# Patient Record
Sex: Male | Born: 1980 | Hispanic: No | Marital: Married | Smoking: Former smoker
Health system: Southern US, Community
[De-identification: ages and names within clinical notes are randomized; demographics above are authoritative.]

## PROBLEM LIST (undated history)

## (undated) DIAGNOSIS — R590 Localized enlarged lymph nodes: Secondary | ICD-10-CM

## (undated) DIAGNOSIS — Z973 Presence of spectacles and contact lenses: Secondary | ICD-10-CM

## (undated) DIAGNOSIS — R918 Other nonspecific abnormal finding of lung field: Secondary | ICD-10-CM

## (undated) DIAGNOSIS — J189 Pneumonia, unspecified organism: Secondary | ICD-10-CM

## (undated) HISTORY — PX: LEG SURGERY: SHX1003

---

## 2019-02-13 ENCOUNTER — Inpatient Hospital Stay (HOSPITAL_COMMUNITY)
Admission: EM | Admit: 2019-02-13 | Discharge: 2019-02-16 | DRG: 194 | Disposition: A | Discharge status: Home / Self Care | Payer: PRIVATE HEALTH INSURANCE | Source: Ambulatory Visit | Attending: Internal Medicine | Admitting: Internal Medicine

## 2019-02-13 ENCOUNTER — Observation Stay (HOSPITAL_COMMUNITY): Payer: PRIVATE HEALTH INSURANCE

## 2019-02-13 ENCOUNTER — Emergency Department (HOSPITAL_COMMUNITY): Payer: PRIVATE HEALTH INSURANCE

## 2019-02-13 ENCOUNTER — Other Ambulatory Visit: Payer: Self-pay

## 2019-02-13 ENCOUNTER — Encounter (HOSPITAL_COMMUNITY): Payer: Self-pay | Admitting: Obstetrics and Gynecology

## 2019-02-13 DIAGNOSIS — J918 Pleural effusion in other conditions classified elsewhere: Secondary | ICD-10-CM | POA: Diagnosis present

## 2019-02-13 DIAGNOSIS — R748 Abnormal levels of other serum enzymes: Secondary | ICD-10-CM

## 2019-02-13 DIAGNOSIS — E46 Unspecified protein-calorie malnutrition: Secondary | ICD-10-CM | POA: Diagnosis present

## 2019-02-13 DIAGNOSIS — Z6821 Body mass index (BMI) 21.0-21.9, adult: Secondary | ICD-10-CM

## 2019-02-13 DIAGNOSIS — J90 Pleural effusion, not elsewhere classified: Secondary | ICD-10-CM | POA: Diagnosis not present

## 2019-02-13 DIAGNOSIS — J189 Pneumonia, unspecified organism: Secondary | ICD-10-CM | POA: Diagnosis not present

## 2019-02-13 DIAGNOSIS — D649 Anemia, unspecified: Secondary | ICD-10-CM

## 2019-02-13 DIAGNOSIS — D509 Iron deficiency anemia, unspecified: Secondary | ICD-10-CM | POA: Diagnosis present

## 2019-02-13 DIAGNOSIS — B999 Unspecified infectious disease: Secondary | ICD-10-CM

## 2019-02-13 DIAGNOSIS — R0602 Shortness of breath: Secondary | ICD-10-CM | POA: Diagnosis not present

## 2019-02-13 DIAGNOSIS — Z20828 Contact with and (suspected) exposure to other viral communicable diseases: Secondary | ICD-10-CM | POA: Diagnosis present

## 2019-02-13 DIAGNOSIS — E876 Hypokalemia: Secondary | ICD-10-CM

## 2019-02-13 DIAGNOSIS — Z87891 Personal history of nicotine dependence: Secondary | ICD-10-CM

## 2019-02-13 DIAGNOSIS — D75839 Thrombocytosis, unspecified: Secondary | ICD-10-CM

## 2019-02-13 DIAGNOSIS — D473 Essential (hemorrhagic) thrombocythemia: Secondary | ICD-10-CM | POA: Diagnosis present

## 2019-02-13 LAB — CBC WITH DIFFERENTIAL/PLATELET
Abs Immature Granulocytes: 0.06 10*3/uL (ref 0.00–0.07)
Basophils Absolute: 0.1 10*3/uL (ref 0.0–0.1)
Basophils Relative: 1 %
Eosinophils Absolute: 0.1 10*3/uL (ref 0.0–0.5)
Eosinophils Relative: 1 %
HCT: 37.9 % — ABNORMAL LOW (ref 39.0–52.0)
Hemoglobin: 12.3 g/dL — ABNORMAL LOW (ref 13.0–17.0)
Immature Granulocytes: 1 %
Lymphocytes Relative: 16 %
Lymphs Abs: 1.2 10*3/uL (ref 0.7–4.0)
MCH: 31.4 pg (ref 26.0–34.0)
MCHC: 32.5 g/dL (ref 30.0–36.0)
MCV: 96.7 fL (ref 80.0–100.0)
Monocytes Absolute: 0.6 10*3/uL (ref 0.1–1.0)
Monocytes Relative: 8 %
Neutro Abs: 5.8 10*3/uL (ref 1.7–7.7)
Neutrophils Relative %: 73 %
Platelets: 549 10*3/uL — ABNORMAL HIGH (ref 150–400)
RBC: 3.92 MIL/uL — ABNORMAL LOW (ref 4.22–5.81)
RDW: 12.8 % (ref 11.5–15.5)
WBC: 7.8 10*3/uL (ref 4.0–10.5)
nRBC: 0 % (ref 0.0–0.2)

## 2019-02-13 LAB — COMPREHENSIVE METABOLIC PANEL
ALT: 43 U/L (ref 0–44)
AST: 23 U/L (ref 15–41)
Albumin: 2.8 g/dL — ABNORMAL LOW (ref 3.5–5.0)
Alkaline Phosphatase: 134 U/L — ABNORMAL HIGH (ref 38–126)
Anion gap: 11 (ref 5–15)
BUN: 8 mg/dL (ref 6–20)
CO2: 23 mmol/L (ref 22–32)
Calcium: 8.4 mg/dL — ABNORMAL LOW (ref 8.9–10.3)
Chloride: 101 mmol/L (ref 98–111)
Creatinine, Ser: 0.8 mg/dL (ref 0.61–1.24)
GFR calc Af Amer: >60 mL/min (ref >60)
GFR calc non Af Amer: >60 mL/min (ref >60)
Glucose, Bld: 86 mg/dL (ref 70–99)
Potassium: 3.4 mmol/L — ABNORMAL LOW (ref 3.5–5.1)
Sodium: 135 mmol/L (ref 135–145)
Total Bilirubin: 0.6 mg/dL (ref 0.3–1.2)
Total Protein: 7.8 g/dL (ref 6.5–8.1)

## 2019-02-13 LAB — BASIC METABOLIC PANEL
Anion gap: 8 (ref 5–15)
BUN: 8 mg/dL (ref 6–20)
CO2: 27 mmol/L (ref 22–32)
Calcium: 8.4 mg/dL — ABNORMAL LOW (ref 8.9–10.3)
Chloride: 102 mmol/L (ref 98–111)
Creatinine, Ser: 0.83 mg/dL (ref 0.61–1.24)
GFR calc Af Amer: >60 mL/min (ref >60)
GFR calc non Af Amer: >60 mL/min (ref >60)
Glucose, Bld: 95 mg/dL (ref 70–99)
Potassium: 3.9 mmol/L (ref 3.5–5.1)
Sodium: 137 mmol/L (ref 135–145)

## 2019-02-13 LAB — C-REACTIVE PROTEIN: CRP: 18.5 mg/dL — ABNORMAL HIGH (ref <1.0)

## 2019-02-13 LAB — PROCALCITONIN: Procalcitonin: <0.1 ng/mL

## 2019-02-13 LAB — SARS CORONAVIRUS 2 BY RT PCR (HOSPITAL ORDER, PERFORMED IN ~~LOC~~ HOSPITAL LAB): SARS Coronavirus 2: NEGATIVE

## 2019-02-13 LAB — MAGNESIUM: Magnesium: 2.3 mg/dL (ref 1.7–2.4)

## 2019-02-13 LAB — FIBRINOGEN: Fibrinogen: >800 mg/dL — ABNORMAL HIGH (ref 210–475)

## 2019-02-13 LAB — LACTATE DEHYDROGENASE: LDH: 203 U/L — ABNORMAL HIGH (ref 98–192)

## 2019-02-13 LAB — FERRITIN: Ferritin: 719 ng/mL — ABNORMAL HIGH (ref 24–336)

## 2019-02-13 LAB — TRIGLYCERIDES: Triglycerides: 71 mg/dL (ref <150)

## 2019-02-13 LAB — LACTIC ACID, PLASMA: Lactic Acid, Venous: 1.4 mmol/L (ref 0.5–1.9)

## 2019-02-13 LAB — D-DIMER, QUANTITATIVE: D-Dimer, Quant: 11.04 ug/mL-FEU — ABNORMAL HIGH (ref 0.00–0.50)

## 2019-02-13 MED ORDER — AZITHROMYCIN 250 MG PO TABS
500.0000 mg | ORAL_TABLET | Freq: Once | ORAL | Status: AC
  Administered 2019-02-13: 500 mg via ORAL
  Filled 2019-02-13: qty 2

## 2019-02-13 MED ORDER — IOHEXOL 350 MG/ML SOLN
100.0000 mL | Freq: Once | INTRAVENOUS | Status: AC | PRN
  Administered 2019-02-13: 100 mL via INTRAVENOUS

## 2019-02-13 MED ORDER — SODIUM CHLORIDE 0.9 % IV SOLN
3.0000 g | Freq: Four times a day (QID) | INTRAVENOUS | Status: DC
  Administered 2019-02-14: 3 g via INTRAVENOUS
  Filled 2019-02-13 (×3): qty 3

## 2019-02-13 MED ORDER — BENZONATATE 100 MG PO CAPS: 200.0000 mg | ORAL_CAPSULE | Freq: Three times a day (TID) | ORAL | Status: DC | PRN

## 2019-02-13 MED ORDER — SODIUM CHLORIDE 0.9 % IV SOLN
INTRAVENOUS | Status: DC
  Administered 2019-02-13: 23:00:00 via INTRAVENOUS

## 2019-02-13 MED ORDER — IOHEXOL 300 MG/ML  SOLN
75.0000 mL | Freq: Once | INTRAMUSCULAR | Status: AC | PRN
  Administered 2019-02-13: 75 mL via INTRAVENOUS

## 2019-02-13 MED ORDER — ACETAMINOPHEN 650 MG RE SUPP: 650.0000 mg | Freq: Four times a day (QID) | RECTAL | Status: DC | PRN

## 2019-02-13 MED ORDER — SODIUM CHLORIDE 0.9 % IV SOLN
3.0000 g | Freq: Once | INTRAVENOUS | Status: AC
  Administered 2019-02-13: 3 g via INTRAVENOUS

## 2019-02-13 MED ORDER — SODIUM CHLORIDE (PF) 0.9 % IJ SOLN
INTRAMUSCULAR | Status: AC
  Filled 2019-02-13: qty 50

## 2019-02-13 MED ORDER — SODIUM CHLORIDE 0.9 % IV SOLN
1.0000 g | Freq: Once | INTRAVENOUS | Status: AC
  Administered 2019-02-13: 1 g via INTRAVENOUS
  Filled 2019-02-13: qty 10

## 2019-02-13 MED ORDER — ACETAMINOPHEN 325 MG PO TABS
650.0000 mg | ORAL_TABLET | Freq: Four times a day (QID) | ORAL | Status: DC | PRN
  Filled 2019-02-13: qty 2

## 2019-02-13 MED ORDER — POTASSIUM CHLORIDE CRYS ER 20 MEQ PO TBCR
30.0000 meq | EXTENDED_RELEASE_TABLET | Freq: Once | ORAL | Status: AC
  Administered 2019-02-13: 30 meq via ORAL
  Filled 2019-02-13: qty 1

## 2019-02-13 NOTE — ED Triage Notes (Signed)
Per EMS: Pt is coming from home Pt has had difficulty breathing x3 weeks and tested negative for Covid last Tuesday.  Pt reportedly had a chest x-ray done and has a reported pleural effusion that they are requesting a CT w contrast for.

## 2019-02-13 NOTE — ED Notes (Signed)
Bed: AC45 Expected date:  Expected time:  Means of arrival:  Comments: EMS-pleural effusion

## 2019-02-13 NOTE — ED Notes (Signed)
ED TO INPATIENT HANDOFF REPORT  Name/Age/Gender Aaron Greer 38 y.o. male  Code Status    Code Status Orders  (From admission, onward)         Start     Ordered   02/13/19 2139  Full code  Continuous     02/13/19 2143        Code Status History    This patient has a current code status but no historical code status.      Home/SNF/Other Home  Chief Complaint Enfusion   Level of Care/Admitting Diagnosis ED Disposition    ED Disposition Condition Comment   Admit  Hospital Area: [facility] [[id number]]  Level of Care: Telemetry [5]  Admit to tele based on following criteria: Other see comments  Comments: Tachycardia  Covid Evaluation: N/A  Diagnosis: Pleural effusion [242230]  Admitting Physician: Shela Leff [[id number]]  Attending Physician: Shela Leff [[id number]]  PT Class (Do Not Modify): Observation [104]  PT Acc Code (Do Not Modify): Observation [10022]       Medical History History reviewed. No pertinent past medical history.  Allergies No Known Allergies  IV Location/Drains/Wounds Patient Lines/Drains/Airways Status   Active Line/Drains/Airways    Name:   Placement date:   Placement time:   Site:   Days:   Peripheral IV 02/13/19 Right Antecubital   02/13/19    1358    Antecubital   less than 1   Peripheral IV 02/13/19 Right Forearm   02/13/19    1650    Forearm   less than 1          Labs/Imaging Results for orders placed or performed during the hospital encounter of 02/13/19 (from the past 48 hour(s))  Basic metabolic panel     Status: Abnormal   Collection Time: 02/13/19  2:16 PM  Result Value Ref Range   Sodium 137 135 - 145 mmol/L   Potassium 3.9 3.5 - 5.1 mmol/L   Chloride 102 98 - 111 mmol/L   CO2 27 22 - 32 mmol/L   Glucose, Bld 95 70 - 99 mg/dL   BUN 8 6 - 20 mg/dL   Creatinine, Ser 0.83 0.61 - 1.24 mg/dL   Calcium 8.4 (L) 8.9 - 10.3 mg/dL   GFR calc non Af Amer >60 >60 mL/min   GFR calc Af Amer >60  >60 mL/min   Anion gap 8 5 - 15    Comment: Performed at [facility], [place] [address] [place], [place] [address]  CBC with Differential     Status: Abnormal   Collection Time: 02/13/19  2:16 PM  Result Value Ref Range   WBC 7.8 4.0 - 10.5 K/uL   RBC 3.92 (L) 4.22 - 5.81 MIL/uL   Hemoglobin 12.3 (L) 13.0 - 17.0 g/dL   HCT 37.9 (L) 39.0 - 52.0 %   MCV 96.7 80.0 - 100.0 fL   MCH 31.4 26.0 - 34.0 pg   MCHC 32.5 30.0 - 36.0 g/dL   RDW 12.8 11.5 - 15.5 %   Platelets 549 (H) 150 - 400 K/uL   nRBC 0.0 0.0 - 0.2 %   Neutrophils Relative % 73 %   Neutro Abs 5.8 1.7 - 7.7 K/uL   Lymphocytes Relative 16 %   Lymphs Abs 1.2 0.7 - 4.0 K/uL   Monocytes Relative 8 %   Monocytes Absolute 0.6 0.1 - 1.0 K/uL   Eosinophils Relative 1 %   Eosinophils Absolute 0.1 0.0 - 0.5 K/uL  Basophils Relative 1 %   Basophils Absolute 0.1 0.0 - 0.1 K/uL   Immature Granulocytes 1 %   Abs Immature Granulocytes 0.06 0.00 - 0.07 K/uL    Comment: Performed at [facility], [place] [address], [place], [place] [address]  SARS Coronavirus 2 [facility] order, Performed in [facility] hospital lab)     Status: None   Collection Time: 02/13/19  4:46 PM  Result Value Ref Range   SARS Coronavirus 2 NEGATIVE NEGATIVE    Comment: (NOTE) If result is NEGATIVE SARS-CoV-2 target nucleic acids are NOT DETECTED. The SARS-CoV-2 RNA is generally detectable in upper and lower  respiratory specimens during the acute phase of infection. The lowest  concentration of SARS-CoV-2 viral copies this assay can detect is 250  copies / mL. A negative result does not preclude SARS-CoV-2 infection  and should not be used as the sole basis for treatment or other  patient management decisions.  A negative result may occur with  improper specimen collection / handling, submission of specimen other  than nasopharyngeal swab, presence of viral mutation(s) within the  areas targeted by this assay, and inadequate  number of viral copies  (<250 copies / mL). A negative result must be combined with clinical  observations, patient history, and epidemiological information. If result is POSITIVE SARS-CoV-2 target nucleic acids are DETECTED. The SARS-CoV-2 RNA is generally detectable in upper and lower  respiratory specimens dur ing the acute phase of infection.  Positive  results are indicative of active infection with SARS-CoV-2.  Clinical  correlation with patient history and other diagnostic information is  necessary to determine patient infection status.  Positive results do  not rule out bacterial infection or co-infection with other viruses. If result is PRESUMPTIVE POSTIVE SARS-CoV-2 nucleic acids MAY BE PRESENT.   A presumptive positive result was obtained on the submitted specimen  and confirmed on repeat testing.  While 2019 novel coronavirus  (SARS-CoV-2) nucleic acids may be present in the submitted sample  additional confirmatory testing may be necessary for epidemiological  and / or clinical management purposes  to differentiate between  SARS-CoV-2 and other Sarbecovirus currently known to infect humans.  If clinically indicated additional testing with an alternate test  methodology [phone]) is advised. The SARS-CoV-2 RNA is generally  detectable in upper and lower respiratory sp ecimens during the acute  phase of infection. The expected result is Negative. Fact Sheet for Patients:  StrictlyIdeas.no Fact Sheet for Healthcare Providers: BankingDealers.co.za This test is not yet approved or cleared by the [place] FDA and has been authorized for detection and/or diagnosis of SARS-CoV-2 by FDA under an Emergency Use Authorization (EUA).  This EUA will remain in effect (meaning this test can be used) for the duration of the COVID-19 declaration under Section 564(b)(1) of the Act, 21 U.S.C. section 360bbb-3(b)(1), unless the  authorization is terminated or revoked sooner. Performed at [facility], [place] [address], [place], [place] [address]   Lactic acid, plasma     Status: None   Collection Time: 02/13/19  4:46 PM  Result Value Ref Range   Lactic Acid, Venous 1.4 0.5 - 1.9 mmol/L    Comment: Performed at [facility], [place] [address], [place],  [address]  Comprehensive metabolic panel     Status: Abnormal   Collection Time: 02/13/19  4:46 PM  Result Value Ref Range   Sodium 135 135 - 145 mmol/L   Potassium 3.4 (L) 3.5 - 5.1 mmol/L  Chloride 101 98 - 111 mmol/L   CO2 23 22 - 32 mmol/L   Glucose, Bld 86 70 - 99 mg/dL   BUN 8 6 - 20 mg/dL   Creatinine, Ser 0.80 0.61 - 1.24 mg/dL   Calcium 8.4 (L) 8.9 - 10.3 mg/dL   Total Protein 7.8 6.5 - 8.1 g/dL   Albumin 2.8 (L) 3.5 - 5.0 g/dL   AST 23 15 - 41 U/L   ALT 43 0 - 44 U/L   Alkaline Phosphatase 134 (H) 38 - 126 U/L   Total Bilirubin 0.6 0.3 - 1.2 mg/dL   GFR calc non Af Amer >60 >60 mL/min   GFR calc Af Amer >60 >60 mL/min   Anion gap 11 5 - 15    Comment: Performed at [facility], [place] [address] [place], [place] [address]  D-dimer, quantitative     Status: Abnormal   Collection Time: 02/13/19  4:46 PM  Result Value Ref Range   D-Dimer, Quant 11.04 (H) 0.00 - 0.50 ug/mL-FEU    Comment: (NOTE) At the manufacturer cut-off of 0.50 ug/mL FEU, this assay has been documented to exclude PE with a sensitivity and negative predictive value of 97 to 99%.  At this time, this assay has not been approved by the FDA to exclude DVT/VTE. Results should be correlated with clinical presentation. Performed at [facility], [place] [address], [place], [place] [address]   Procalcitonin     Status: None   Collection Time: 02/13/19  4:46 PM  Result Value Ref Range   Procalcitonin <0.10 ng/mL    Comment:        Interpretation: PCT (Procalcitonin) <= 0.5 ng/mL: Systemic infection  (sepsis) is not likely. Local bacterial infection is possible. (NOTE)       Sepsis PCT Algorithm           Lower Respiratory Tract                                      Infection PCT Algorithm    ----------------------------     ----------------------------         PCT < 0.25 ng/mL                PCT < 0.10 ng/mL         Strongly encourage             Strongly discourage   discontinuation of antibiotics    initiation of antibiotics    ----------------------------     -----------------------------       PCT 0.25 - 0.50 ng/mL            PCT 0.10 - 0.25 ng/mL               OR       >80% decrease in PCT            Discourage initiation of                                            antibiotics      Encourage discontinuation           of antibiotics    ----------------------------     -----------------------------         PCT >= 0.50 ng/mL  PCT 0.26 - 0.50 ng/mL               AND        <80% decrease in PCT             Encourage initiation of                                             antibiotics       Encourage continuation           of antibiotics    ----------------------------     -----------------------------        PCT >= 0.50 ng/mL                  PCT > 0.50 ng/mL               AND         increase in PCT                  Strongly encourage                                      initiation of antibiotics    Strongly encourage escalation           of antibiotics                                     -----------------------------                                           PCT <= 0.25 ng/mL                                                 OR                                        > 80% decrease in PCT                                     Discontinue / Do not initiate                                             antibiotics Performed at [place] [address] [place], [place] [address]   Lactate dehydrogenase     Status: Abnormal   Collection Time:  02/13/19  4:46 PM  Result Value Ref Range   LDH 203 (H) 98 - 192 U/L    Comment: Performed at [facility], [place] [address] [place], [place] [address]  Ferritin     Status: Abnormal   Collection Time: 02/13/19  4:46 PM  Result Value Ref  Range   Ferritin 719 (H) 24 - 336 ng/mL    Comment: Performed at [facility], [place] [address], [place], [place] [address]  Triglycerides     Status: None   Collection Time: 02/13/19  4:46 PM  Result Value Ref Range   Triglycerides 71 <150 mg/dL    Comment: Performed at [facility], [place] [address], [place], [place] [address]  Fibrinogen     Status: Abnormal   Collection Time: 02/13/19  4:46 PM  Result Value Ref Range   Fibrinogen >800 (H) 210 - 475 mg/dL    Comment: REPEATED TO VERIFY Performed at [facility], [place] [address], [place], [place] [address]   C-reactive protein     Status: Abnormal   Collection Time: 02/13/19  4:46 PM  Result Value Ref Range   CRP 18.5 (H) <1.0 mg/dL    Comment: Performed at [facility], [place] [address], [place], [place] [address]   Ct Chest W Contrast  Result Date: 02/13/2019 CLINICAL DATA:  Pleural effusion. EXAM: CT CHEST WITH CONTRAST TECHNIQUE: Multidetector CT imaging of the chest was performed during intravenous contrast administration. CONTRAST:  65mL OMNIPAQUE IOHEXOL 300 MG/ML  SOLN COMPARISON:  None. FINDINGS: Cardiovascular: No significant vascular findings. Normal heart size. No pericardial effusion. Mediastinum/Nodes: No enlarged mediastinal, hilar, or axillary lymph nodes. Thyroid gland, trachea, and esophagus demonstrate no significant findings. Lungs/Pleura: No pneumothorax is noted. Left lung is clear. Large probably loculated right pleural effusion is noted resulting in atelectasis of the right lung, particularly the right lower and middle lobes. Upper Abdomen: No acute abnormality. Musculoskeletal: No chest wall  abnormality. No acute or significant osseous findings. IMPRESSION: Large probably loculated right pleural effusion is noted resulting in significant atelectasis of the right lower and middle lobes. Electronically Signed   By: Marijo Conception M.D.   On: 02/13/2019 16:00    Pending Labs Unresulted Labs (From admission, onward)    Start     Ordered   02/14/19 0500  Vitamin B12  (Anemia Panel (PNL))  Tomorrow morning,   R     02/13/19 2143   02/14/19 0500  Folate  (Anemia Panel (PNL))  Tomorrow morning,   R     02/13/19 2143   02/14/19 0500  Iron and TIBC  (Anemia Panel (PNL))  Tomorrow morning,   R     02/13/19 2143   02/14/19 0500  Ferritin  (Anemia Panel (PNL))  Tomorrow morning,   R     02/13/19 2143   02/14/19 0500  Reticulocytes  (Anemia Panel (PNL))  Tomorrow morning,   R     02/13/19 2143   02/14/19 9417  Basic metabolic panel  Tomorrow morning,   R     02/13/19 2143   02/14/19 0500  Gamma GT  Tomorrow morning,   R     02/13/19 2143   02/13/19 2141  Magnesium  Add-on,   R     02/13/19 2143   02/13/19 2138  HIV antibody (Routine Testing)  Once,   R     02/13/19 2143   02/13/19 1626  Blood Culture (routine x 2)  BLOOD CULTURE X 2,   STAT     02/13/19 1626          Vitals/Pain Today's Vitals   02/13/19 2030 02/13/19 2100 02/13/19 2103 02/13/19 2130  BP: 104/69 109/70  110/70  Pulse: (!) 104 (!) 101  (!) 108  Resp: (!) 29 Marland Kitchen)  28  17  Temp:      TempSrc:      SpO2: 97% 96%  96%  PainSc:   0-No pain     Isolation Precautions No active isolations  Medications Medications  sodium chloride (PF) 0.9 % injection (has no administration in time range)  acetaminophen (TYLENOL) tablet 650 mg (has no administration in time range)    Or  acetaminophen (TYLENOL) suppository 650 mg (has no administration in time range)  0.9 %  sodium chloride infusion (has no administration in time range)  potassium chloride (K-DUR) CR tablet 30 mEq (has no administration in time range)   benzonatate (TESSALON) capsule 200 mg (has no administration in time range)  Ampicillin-Sulbactam (UNASYN) 3 g in sodium chloride 0.9 % 100 mL IVPB (has no administration in time range)  iohexol (OMNIPAQUE) 300 MG/ML solution 75 mL (75 mLs Intravenous Contrast Given 02/13/19 1543)  cefTRIAXone (ROCEPHIN) 1 g in sodium chloride 0.9 % 100 mL IVPB (0 g Intravenous Stopped 02/13/19 1839)  azithromycin (ZITHROMAX) tablet 500 mg (500 mg Oral Given 02/13/19 1723)    Mobility walks

## 2019-02-13 NOTE — ED Provider Notes (Signed)
[facility] DEPT Provider Note   CSN: [id number] Arrival date & time: 02/13/19  1216    History   Chief Complaint Chief Complaint  Patient presents with  . Pleural Effusion    HPI Aaron Greer is a 38 y.o. male.     Aaron Greer is a 38 y.o. male who is otherwise healthy, presents to the emergency department for evaluation of pleural effusion via EMS.  Patient reports that he has been experiencing worsening shortness of breath over the past 3 weeks.  [facility], had negative COVID testing this past Tuesday, but returns today due to persistent symptoms, a chest x-ray was performed which shows a large right-sided pleural effusion that is likely loculated.  Urgent care sent him here to the emergency department for CT with contrast for further characterization and evaluation of pleural effusion.  Patient reports that he is intermittently had some pains over the right side of his chest.  Pain is worse with deep breath.  He has not had any trauma or injury to the chest.  He has had an intermittent productive cough but has not had fever, reports occasional chills.  He is unsure of any sick contacts.  He denies any recent unintended weight loss.  He has had some nausea but no vomiting, mildly decreased appetite.  No abdominal pain.  Has not noticed any bumps or swollen lymph nodes.  No headache or vision change, no numbness or weakness.  He reports occasional smoking 10 years ago but no recent smoking history.  Denies other substance use.     History reviewed. No pertinent past medical history.  There are no active problems to display for this patient.   Past Surgical History:  Procedure Laterality Date  . LEG SURGERY          Home Medications    Prior to Admission medications   Medication Sig Start Date End Date Taking? Authorizing Provider  benzonatate (TESSALON) 100 MG capsule Take 100 mg by mouth 3 (three) times  daily as needed for cough.   Yes [provider]  ondansetron (ZOFRAN-ODT) 8 MG disintegrating tablet Take 8 mg by mouth every 8 (eight) hours as needed for nausea or vomiting.   Yes [provider]  promethazine-dextromethorphan (PROMETHAZINE-DM) 6.25-15 MG/5ML syrup Take 5 mLs by mouth 4 (four) times daily as needed for cough.   Yes [provider]    Family History No family history on file.  Social History Social History   Tobacco Use  . Smoking status: Former Smoker    Years: 10.00  . Smokeless tobacco: Never Used  . Tobacco comment: Quit in 2010  Substance Use Topics  . Alcohol use: Yes    Comment: Social  . Drug use: Never     Allergies   Patient has no known allergies.   Review of Systems Review of Systems  Constitutional: Positive for chills. Negative for fever and unexpected weight change.  HENT: Negative for congestion, rhinorrhea and sore throat.   Respiratory: Positive for choking and shortness of breath. Negative for chest tightness and wheezing.   Cardiovascular: Positive for chest pain. Negative for palpitations and leg swelling.  Gastrointestinal: Positive for nausea. Negative for abdominal pain, constipation and vomiting.  Genitourinary: Negative for dysuria and frequency.  Musculoskeletal: Negative for arthralgias, back pain (Left upper) and myalgias.  Skin: Negative for color change and rash.  Neurological: Negative for dizziness, syncope and light-headedness.  All other systems  reviewed and are negative.    Physical Exam Updated Vital Signs BP 108/67 (BP Location: Right Arm)   Pulse (!) 105   Temp 99 F (37.2 C) (Oral)   Resp 19   SpO2 98%   Physical Exam Vitals signs and nursing note reviewed.  Constitutional:      General: He is not in acute distress.    Appearance: Normal appearance. He is well-developed and normal weight. He is not ill-appearing, toxic-appearing or diaphoretic.     Comments: Patient is in no  acute distress, nontoxic appearing  HENT:     Head: Normocephalic and atraumatic.     Mouth/Throat:     Mouth: Mucous membranes are moist.     Pharynx: Oropharynx is clear.  Eyes:     General:        Right eye: No discharge.        Left eye: No discharge.  Neck:     Musculoskeletal: Neck supple.  Cardiovascular:     Rate and Rhythm: Normal rate and regular rhythm.     Pulses: Normal pulses.     Heart sounds: Normal heart sounds. No murmur. No friction rub. No gallop.   Pulmonary:     Effort: No respiratory distress.     Comments: Patient breathing comfortably on room air while sitting still, able to speak in complete short sentences, satting well on room air without tachypnea or significant increased respiratory effort.  Left lung field is clear.  Right lung field with breath sounds audible only in the apex, no tenderness or crepitus over the ribs.  No overlying ecchymosis or signs of trauma. Abdominal:     General: Abdomen is flat. Bowel sounds are normal. There is no distension.     Palpations: Abdomen is soft.     Tenderness: There is no abdominal tenderness. There is no guarding.  Musculoskeletal:     Right lower leg: No edema.     Left lower leg: No edema.  Skin:    General: Skin is warm and dry.  Neurological:     Mental Status: He is alert and oriented to person, place, and time. Mental status is at baseline.     Coordination: Coordination normal.  Psychiatric:        Mood and Affect: Mood normal.        Behavior: Behavior normal.      ED Treatments / Results  Labs (all labs ordered are listed, but only abnormal results are displayed) Labs Reviewed  BASIC METABOLIC PANEL - Abnormal; Notable for the following components:      Result Value   Calcium 8.4 (*)    All other components within normal limits  CBC WITH DIFFERENTIAL/PLATELET - Abnormal; Notable for the following components:   RBC 3.92 (*)    Hemoglobin 12.3 (*)    HCT 37.9 (*)    Platelets 549 (*)     All other components within normal limits    EKG None  Radiology None  Procedures Procedures (including critical care time)  Medications Ordered in ED Medications  sodium chloride (PF) 0.9 % injection (has no administration in time range)  iohexol (OMNIPAQUE) 300 MG/ML solution 75 mL (75 mLs Intravenous Contrast Given 02/13/19 1543)     Initial Impression / Assessment and Plan / ED Course  I have reviewed the triage vital signs and the nursing notes.  Pertinent labs & imaging results that were available during my care of the patient were reviewed by me and considered in  my medical decision making (see chart for details).  Patient sent from urgent care due to 3 weeks of persistent shortness of breath, on chest x-ray today found to have a large right-sided pleural effusion, was sent here for CT of the chest and further evaluation.  On arrival patient is in no acute distress, afebrile he has had some productive cough.  Had recent negative COVID testing at urgent care.  Is overall well-appearing, currently satting well on room air, has been intermittently tachycardic.  Reports dyspnea is worse with exertion.  He is also had some pleuritic pain on the right.  Given the patient is afebrile I have lower suspicion for empyema, patient does not have any known cancer diagnosis remote smoking history.  Could be a malignant effusion.  Patient does not have signs of fluid overload elsewhere, no lower extremity edema or abdominal distention.  We will get basic labs and a CT of the chest with contrast to better characterize pleural effusion.  Suspect patient may require admission for drainage and analysis of pleural fluid.  Labs significant for hemoglobin of 12.3, no leukocytosis further supporting that this is less likely of an infectious etiology, no acute electrolyte derangements.  At shift change still awaiting chest CT, care signed out to PA Domenic Moras who will follow-up on CT, anticipate admission.   Final Clinical Impressions(s) / ED Diagnoses   Final diagnoses:  Pleural effusion, right    ED Discharge Orders    None       Janet Berlin 02/13/19 [place], DO 02/14/19 [phone]

## 2019-02-13 NOTE — Progress Notes (Signed)
Pharmacy Antibiotic Note  Aaron Greer is a 38 y.o. male admitted on 1/66/0630 with Complicated pleural effusion.  Pharmacy has been consulted for Unasyn dosing.  Plan: Unasyn 3gm iv q6hr  Height: 5\' 8"  (172.7 cm) Weight: 143 lb 9.6 oz (65.1 kg) IBW/kg (Calculated) : 68.4  Temp (24hrs), Avg:99.8 F (37.7 C), Min:99 F (37.2 C), Max:101 F (38.3 C)  Recent Labs  Lab 02/13/19 1416 02/13/19 1646  WBC 7.8  --   CREATININE 0.83 0.80  LATICACIDVEN  --  1.4    Estimated Creatinine Clearance: 117.5 mL/min (by C-G formula based on SCr of 0.8 mg/dL).    No Known Allergies  Antimicrobials this admission: Unasyn 02/13/2019 >>  Dose adjustments this admission: -  Microbiology results: -  Thank you for allowing pharmacy to be a part of this patient's care.  Nani Skillern [place] 02/13/2019 10:16 PM

## 2019-02-13 NOTE — H&P (Signed)
History and Physical    Aaron Greer IRJ:[id number] DOB: [birth year] DOA: 02/13/2019  PCP: Patient, No Pcp Per Patient coming from: Home  Chief Complaint: Pleural effusion  HPI: Aaron Greer is a 38 y.o. male with no significant past medical history presenting to the hospital via EMS for evaluation of a pleural effusion.  Patient has been experiencing worsening shortness of breath over the past 3 weeks.  [facility] and had a negative COVID test this past Tuesday but returned today due to persistent symptoms.  Chest x-ray done at the urgent care center revealed a large right-sided pleural effusion that is likely loculated.  Urgent care center sent him to the ED for CT with contrast for further characterization and evaluation of the pleural effusion. Patient reports 3-week history of dyspnea, especially on exertion and cough.  He has been having fevers and chills for which he is taking Tylenol.  Also having right-sided pleuritic chest pain. Denies any history of blood clots.  Denies history of any other medical problems.  States he was previously smoking occasionally but quit 10 years ago.  Review of Systems: As per HPI otherwise 10 point review of systems negative.  History reviewed. No pertinent past medical history.  Past Surgical History:  Procedure Laterality Date   LEG SURGERY       reports that he has quit smoking. He quit after 10.00 years of use. He has never used smokeless tobacco. He reports current alcohol use. He reports that he does not use drugs.  No Known Allergies  No family history on file.  Prior to Admission medications   Medication Sig Start Date End Date Taking? Authorizing Provider  benzonatate (TESSALON) 100 MG capsule Take 100 mg by mouth 3 (three) times daily as needed for cough.   Yes [provider]  ondansetron (ZOFRAN-ODT) 8 MG disintegrating tablet Take 8 mg by mouth every 8 (eight) hours as needed for nausea or vomiting.    Yes [provider]  promethazine-dextromethorphan (PROMETHAZINE-DM) 6.25-15 MG/5ML syrup Take 5 mLs by mouth 4 (four) times daily as needed for cough.   Yes [provider]    Physical Exam: Vitals:   02/13/19 2100 02/13/19 2130 02/13/19 2210 02/13/19 2214  BP: 109/70 110/70  113/71  Pulse: (!) 101 (!) 108  (!) 101  Resp: (!) 28 17  14   Temp:    99.4 F (37.4 C)  TempSrc:    Oral  SpO2: 96% 96%  97%  Weight:   65.1 kg   Height:   5\' 8"  (1.727 m)     Physical Exam  Constitutional: He is oriented to person, place, and time. No distress.  HENT:  Head: Normocephalic.  Mouth/Throat: Oropharynx is clear and moist.  Eyes: Right eye exhibits no discharge. Left eye exhibits no discharge.  Neck: Neck supple.  Cardiovascular: Normal rate, regular rhythm and intact distal pulses.  Pulmonary/Chest: Effort normal. No respiratory distress. He has no wheezes. He has no rales.  Speaking clearly in full sentences.  Satting well on room air. Right lung: Decreased breath sounds in the lower and mid lung fields. Left lung: Normal breath sounds.  Abdominal: Soft. Bowel sounds are normal. He exhibits no distension. There is no abdominal tenderness. There is no guarding.  Musculoskeletal:        General: No edema.     Comments: No erythema, edema, increased warmth, or tenderness of lower extremities.  Neurological: He is alert and oriented to person, place,  and time.  Skin: Skin is warm and dry. He is not diaphoretic.     Labs on Admission: I have personally reviewed following labs and imaging studies  CBC: Recent Labs  Lab 02/13/19 1416  WBC 7.8  NEUTROABS 5.8  HGB 12.3*  HCT 37.9*  MCV 96.7  PLT 332*   Basic Metabolic Panel: Recent Labs  Lab 02/13/19 1416 02/13/19 1646 02/13/19 1700  NA 137 135  --   K 3.9 3.4*  --   CL 102 101  --   CO2 27 23  --   GLUCOSE 95 86  --   BUN 8 8  --   CREATININE 0.83 0.80  --   CALCIUM 8.4* 8.4*  --   MG  --   --  2.3    GFR: Estimated Creatinine Clearance: 117.5 mL/min (by C-G formula based on SCr of 0.8 mg/dL). Liver Function Tests: Recent Labs  Lab 02/13/19 1646  AST 23  ALT 43  ALKPHOS 134*  BILITOT 0.6  PROT 7.8  ALBUMIN 2.8*   No results for input(s): LIPASE, AMYLASE in the last 168 hours. No results for input(s): AMMONIA in the last 168 hours. Coagulation Profile: No results for input(s): INR, PROTIME in the last 168 hours. Cardiac Enzymes: No results for input(s): CKTOTAL, CKMB, CKMBINDEX, TROPONINI in the last 168 hours. BNP (last 3 results) No results for input(s): PROBNP in the last 8760 hours. HbA1C: No results for input(s): HGBA1C in the last 72 hours. CBG: No results for input(s): GLUCAP in the last 168 hours. Lipid Profile: Recent Labs    02/13/19 1646  TRIG 71   Thyroid Function Tests: No results for input(s): TSH, T4TOTAL, FREET4, T3FREE, THYROIDAB in the last 72 hours. Anemia Panel: Recent Labs    02/13/19 1646  FERRITIN 719*   Urine analysis: No results found for: COLORURINE, APPEARANCEUR, LABSPEC, [place], GLUCOSEU, HGBUR, BILIRUBINUR, KETONESUR, PROTEINUR, UROBILINOGEN, NITRITE, LEUKOCYTESUR  Radiological Exams on Admission: Ct Chest W Contrast  Result Date: 02/13/2019 CLINICAL DATA:  Pleural effusion. EXAM: CT CHEST WITH CONTRAST TECHNIQUE: Multidetector CT imaging of the chest was performed during intravenous contrast administration. CONTRAST:  65mL OMNIPAQUE IOHEXOL 300 MG/ML  SOLN COMPARISON:  None. FINDINGS: Cardiovascular: No significant vascular findings. Normal heart size. No pericardial effusion. Mediastinum/Nodes: No enlarged mediastinal, hilar, or axillary lymph nodes. Thyroid gland, trachea, and esophagus demonstrate no significant findings. Lungs/Pleura: No pneumothorax is noted. Left lung is clear. Large probably loculated right pleural effusion is noted resulting in atelectasis of the right lung, particularly the right lower and middle lobes. Upper  Abdomen: No acute abnormality. Musculoskeletal: No chest wall abnormality. No acute or significant osseous findings. IMPRESSION: Large probably loculated right pleural effusion is noted resulting in significant atelectasis of the right lower and middle lobes. Electronically Signed   By: Marijo Conception M.D.   On: 02/13/2019 16:00   Ct Angio Chest Pe W Or Wo Contrast  Result Date: 02/13/2019 CLINICAL DATA:  Shortness of breath, elevated D-dimer EXAM: CT ANGIOGRAPHY CHEST WITH CONTRAST TECHNIQUE: Multidetector CT imaging of the chest was performed using the standard protocol during bolus administration of intravenous contrast. Multiplanar CT image reconstructions and MIPs were obtained to evaluate the vascular anatomy. CONTRAST:  160mL OMNIPAQUE IOHEXOL 350 MG/ML SOLN COMPARISON:  Chest CT 02/13/2019 FINDINGS: Cardiovascular: Satisfactory opacification of the pulmonary arteries to the segmental level. No evidence of pulmonary embolism. Normal heart size. No pericardial effusion. Nonaneurysmal aorta. No dissection is seen. Mediastinum/Nodes: Midline trachea. No thyroid mass. Subcarinal  lymph node measuring up to 16 mm. Esophagus within normal limits. Lungs/Pleura: Large loculated right pleural effusion with compressive right lung atelectasis. The left lung is clear. Upper Abdomen: No acute abnormality. Musculoskeletal: No chest wall abnormality. No acute or significant osseous findings. Review of the MIP images confirms the above findings. IMPRESSION: 1. Negative for acute pulmonary embolus or aortic dissection. 2. Large loculated right pleural effusion with compressive atelectasis of the right lung Electronically Signed   By: Donavan Foil M.D.   On: 02/13/2019 22:23    EKG: Independently reviewed.  Sinus rhythm (heart rate 95).  Assessment/Plan Principal Problem:   Pleural effusion Active Problems:   Normocytic anemia   Thrombocytosis (HCC)   Elevated alkaline phosphatase level   Hypokalemia     Complicated pleural effusion Patient presenting with a 3-week history of persistent shortness of breath.  Febrile with temperature 101 F.  Slightly tachycardic.  No increased work of breathing at this time.  Satting well on room air.  No leukocytosis.  Lactic acid normal.  Repeat SARS-CoV-2 test done today in the ED again negative.  D-dimer significantly elevated at 11.04.  Procalcitonin less than 0.10.  LDH mildly elevated.  Ferritin elevated.  Triglycerides normal.  Fibrinogen significantly elevated at >800.  CRP elevated at 18.5.  Does not appear volume overloaded on exam.  Chest CT with contrast showing large probably loculated right pleural effusion resulting in significant atelectasis of the right lower and middle lobes.  CT angiogram negative for acute PE. No clinical signs of DVT on exam. -ED physician discussed the case with IR, patient will undergo thoracentesis in the morning. -IR ultrasound-guided thoracentesis and pleural fluid analysis labs have been ordered -IV Unasyn -Check BNP -Blood culture x2 -Supplemental oxygen if needed -Tessalon Perles PRN cough -Continuous pulse ox  Normocytic anemia Hemoglobin 12.3, MCV 96.7.  No prior labs for comparison. -Anemia panel  Thrombocytosis Platelet count 549.  No prior labs for comparison.  Not a current cigarette smoker. -Repeat BMP in a.m. to confirm  Elevated alkaline phosphatase Alkaline phosphatase mildly elevated 134, remainder of LFTs normal.  No prior labs for comparison. -Check GGT to differentiate between intra vs extrahepatic cause of elevated alkaline phosphatase  Mild hypokalemia Potassium 3.4.  Magnesium normal. -Replete potassium. Continue to monitor BMP.  DVT prophylaxis: SCDs Code Status: Full code Family Communication: No family available. Disposition Plan: Anticipate discharge after clinical improvement. Consults called: Interventional radiology (Dr. Kathlene Cote) Admission status: Observation, telemetry  This  chart was dictated using voice recognition software.  Despite best efforts to proofread, errors can occur which can change the documentation meaning.  Shela Leff MD [facility] Pager [phone]  If 7PM-7AM, please contact night-coverage [email or web address] Password [email or web address]  02/14/2019, 12:01 AM

## 2019-02-13 NOTE — ED Provider Notes (Signed)
Received signout at the beginning of shift.  Patient here with progressive shortness of breath ongoing for the past 3 weeks.  Was initially seen in urgent care several days ago and had a  negative COVID-19 test.  Chest x-ray today shows R pleural effusion.  Patient sent here for CT scan of the chest for further evaluation.  Anticipate hospitalization for further work-up of his condition.  4:27 PM For CT demonstrated large probably loculated right pleural effusion resulting in significant atelectasis of the right lower and middle lobe.  On reevaluation, patient did endorse having fever and chills for the past week as well as dry cough.  Denies any recent sick contact.  No history of cancer.  Although he did have a negative COVID-19 test 10 days ago, significant right pleural effusion fever, patient will need to be admitted for evaluation.  Will obtain COVID-19 associated lab.  Also initiated Rocephin and Zithromax antibiotic.  8:10 PM COVID-19 test is negative. Will consult for admission.  Pt does meet sepsis criteria (fever, tachycardia, tachypnea, PNA).  Sepsis reassessment done.  Normal lactic acid, therefore pt was not given fluid resuscitation at 26ml/kg.  His inflammatory markers are elevated.   9:15 PM Appreciate consultation from [facility] Dr. Marlowe Sax who agrees to admit pt.  She request IR to be involved for thoracentesis.  I spoke with IR Dr. Kathlene Cote who request order to be placed and pt will receive thoracentesis tomorrow.    CRITICAL CARE Performed by: Domenic Moras Total critical care time: 45 minutes Critical care time was exclusive of separately billable procedures and treating other patients. Critical care was necessary to treat or prevent imminent or life-threatening deterioration. Critical care was time spent personally by me on the following activities: development of treatment plan with patient and/or surrogate as well as nursing, discussions with consultants, evaluation of  patient's response to treatment, examination of patient, obtaining history from patient or surrogate, ordering and performing treatments and interventions, ordering and review of laboratory studies, ordering and review of radiographic studies, pulse oximetry and re-evaluation of patient's condition.  BP 109/70   Pulse (!) 101   Temp 99.8 F (37.7 C) (Oral)   Resp (!) 28   SpO2 96%   Results for orders placed or performed during the hospital encounter of 02/13/19  SARS Coronavirus 2 [facility] order, Performed in [facility] hospital lab)  Result Value Ref Range   SARS Coronavirus 2 NEGATIVE NEGATIVE  Basic metabolic panel  Result Value Ref Range   Sodium 137 135 - 145 mmol/L   Potassium 3.9 3.5 - 5.1 mmol/L   Chloride 102 98 - 111 mmol/L   CO2 27 22 - 32 mmol/L   Glucose, Bld 95 70 - 99 mg/dL   BUN 8 6 - 20 mg/dL   Creatinine, Ser 0.83 0.61 - 1.24 mg/dL   Calcium 8.4 (L) 8.9 - 10.3 mg/dL   GFR calc non Af Amer >60 >60 mL/min   GFR calc Af Amer >60 >60 mL/min   Anion gap 8 5 - 15  CBC with Differential  Result Value Ref Range   WBC 7.8 4.0 - 10.5 K/uL   RBC 3.92 (L) 4.22 - 5.81 MIL/uL   Hemoglobin 12.3 (L) 13.0 - 17.0 g/dL   HCT 37.9 (L) 39.0 - 52.0 %   MCV 96.7 80.0 - 100.0 fL   MCH 31.4 26.0 - 34.0 pg   MCHC 32.5 30.0 - 36.0 g/dL   RDW 12.8 11.5 - 15.5 %   Platelets  549 (H) 150 - 400 K/uL   nRBC 0.0 0.0 - 0.2 %   Neutrophils Relative % 73 %   Neutro Abs 5.8 1.7 - 7.7 K/uL   Lymphocytes Relative 16 %   Lymphs Abs 1.2 0.7 - 4.0 K/uL   Monocytes Relative 8 %   Monocytes Absolute 0.6 0.1 - 1.0 K/uL   Eosinophils Relative 1 %   Eosinophils Absolute 0.1 0.0 - 0.5 K/uL   Basophils Relative 1 %   Basophils Absolute 0.1 0.0 - 0.1 K/uL   Immature Granulocytes 1 %   Abs Immature Granulocytes 0.06 0.00 - 0.07 K/uL  Lactic acid, plasma  Result Value Ref Range   Lactic Acid, Venous 1.4 0.5 - 1.9 mmol/L  Comprehensive metabolic panel  Result Value Ref Range   Sodium 135 135 -  145 mmol/L   Potassium 3.4 (L) 3.5 - 5.1 mmol/L   Chloride 101 98 - 111 mmol/L   CO2 23 22 - 32 mmol/L   Glucose, Bld 86 70 - 99 mg/dL   BUN 8 6 - 20 mg/dL   Creatinine, Ser 0.80 0.61 - 1.24 mg/dL   Calcium 8.4 (L) 8.9 - 10.3 mg/dL   Total Protein 7.8 6.5 - 8.1 g/dL   Albumin 2.8 (L) 3.5 - 5.0 g/dL   AST 23 15 - 41 U/L   ALT 43 0 - 44 U/L   Alkaline Phosphatase 134 (H) 38 - 126 U/L   Total Bilirubin 0.6 0.3 - 1.2 mg/dL   GFR calc non Af Amer >60 >60 mL/min   GFR calc Af Amer >60 >60 mL/min   Anion gap 11 5 - 15  D-dimer, quantitative  Result Value Ref Range   D-Dimer, Quant 11.04 (H) 0.00 - 0.50 ug/mL-FEU  Procalcitonin  Result Value Ref Range   Procalcitonin <0.10 ng/mL  Lactate dehydrogenase  Result Value Ref Range   LDH 203 (H) 98 - 192 U/L  Ferritin  Result Value Ref Range   Ferritin 719 (H) 24 - 336 ng/mL  Triglycerides  Result Value Ref Range   Triglycerides 71 <150 mg/dL  Fibrinogen  Result Value Ref Range   Fibrinogen >800 (H) 210 - 475 mg/dL  C-reactive protein  Result Value Ref Range   CRP 18.5 (H) <1.0 mg/dL   Ct Chest W Contrast  Result Date: 02/13/2019 CLINICAL DATA:  Pleural effusion. EXAM: CT CHEST WITH CONTRAST TECHNIQUE: Multidetector CT imaging of the chest was performed during intravenous contrast administration. CONTRAST:  42mL OMNIPAQUE IOHEXOL 300 MG/ML  SOLN COMPARISON:  None. FINDINGS: Cardiovascular: No significant vascular findings. Normal heart size. No pericardial effusion. Mediastinum/Nodes: No enlarged mediastinal, hilar, or axillary lymph nodes. Thyroid gland, trachea, and esophagus demonstrate no significant findings. Lungs/Pleura: No pneumothorax is noted. Left lung is clear. Large probably loculated right pleural effusion is noted resulting in atelectasis of the right lung, particularly the right lower and middle lobes. Upper Abdomen: No acute abnormality. Musculoskeletal: No chest wall abnormality. No acute or significant osseous findings.  IMPRESSION: Large probably loculated right pleural effusion is noted resulting in significant atelectasis of the right lower and middle lobes. Electronically Signed   By: Marijo Conception M.D.   On: 02/13/2019 16:00       Domenic Moras, PA-C 02/13/19 2116    Virgel Manifold, MD 02/13/19 2257

## 2019-02-14 ENCOUNTER — Inpatient Hospital Stay (HOSPITAL_COMMUNITY): Payer: PRIVATE HEALTH INSURANCE

## 2019-02-14 ENCOUNTER — Observation Stay (HOSPITAL_COMMUNITY): Payer: PRIVATE HEALTH INSURANCE

## 2019-02-14 DIAGNOSIS — E46 Unspecified protein-calorie malnutrition: Secondary | ICD-10-CM | POA: Diagnosis present

## 2019-02-14 DIAGNOSIS — D75839 Thrombocytosis, unspecified: Secondary | ICD-10-CM

## 2019-02-14 DIAGNOSIS — E876 Hypokalemia: Secondary | ICD-10-CM | POA: Diagnosis present

## 2019-02-14 DIAGNOSIS — Z87891 Personal history of nicotine dependence: Secondary | ICD-10-CM | POA: Diagnosis not present

## 2019-02-14 DIAGNOSIS — Z6821 Body mass index (BMI) 21.0-21.9, adult: Secondary | ICD-10-CM | POA: Diagnosis not present

## 2019-02-14 DIAGNOSIS — D509 Iron deficiency anemia, unspecified: Secondary | ICD-10-CM | POA: Diagnosis present

## 2019-02-14 DIAGNOSIS — Z20828 Contact with and (suspected) exposure to other viral communicable diseases: Secondary | ICD-10-CM | POA: Diagnosis present

## 2019-02-14 DIAGNOSIS — D649 Anemia, unspecified: Secondary | ICD-10-CM

## 2019-02-14 DIAGNOSIS — J918 Pleural effusion in other conditions classified elsewhere: Secondary | ICD-10-CM | POA: Diagnosis present

## 2019-02-14 DIAGNOSIS — D473 Essential (hemorrhagic) thrombocythemia: Secondary | ICD-10-CM

## 2019-02-14 DIAGNOSIS — J189 Pneumonia, unspecified organism: Secondary | ICD-10-CM | POA: Diagnosis present

## 2019-02-14 DIAGNOSIS — J181 Lobar pneumonia, unspecified organism: Secondary | ICD-10-CM

## 2019-02-14 DIAGNOSIS — R748 Abnormal levels of other serum enzymes: Secondary | ICD-10-CM

## 2019-02-14 DIAGNOSIS — R0602 Shortness of breath: Secondary | ICD-10-CM | POA: Diagnosis present

## 2019-02-14 LAB — BASIC METABOLIC PANEL
Anion gap: 9 (ref 5–15)
BUN: 10 mg/dL (ref 6–20)
CO2: 24 mmol/L (ref 22–32)
Calcium: 8.1 mg/dL — ABNORMAL LOW (ref 8.9–10.3)
Chloride: 102 mmol/L (ref 98–111)
Creatinine, Ser: 0.82 mg/dL (ref 0.61–1.24)
GFR calc Af Amer: >60 mL/min (ref >60)
GFR calc non Af Amer: >60 mL/min (ref >60)
Glucose, Bld: 113 mg/dL — ABNORMAL HIGH (ref 70–99)
Potassium: 3.6 mmol/L (ref 3.5–5.1)
Sodium: 135 mmol/L (ref 135–145)

## 2019-02-14 LAB — BODY FLUID CELL COUNT WITH DIFFERENTIAL
Eos, Fluid: 0 %
Lymphs, Fluid: 94 %
Monocyte-Macrophage-Serous Fluid: 5 % — ABNORMAL LOW (ref 50–90)
Neutrophil Count, Fluid: 1 % (ref 0–25)
Total Nucleated Cell Count, Fluid: 2473 cu mm — ABNORMAL HIGH (ref 0–1000)

## 2019-02-14 LAB — VITAMIN B12: Vitamin B-12: 1115 pg/mL — ABNORMAL HIGH (ref 180–914)

## 2019-02-14 LAB — RETICULOCYTES
Immature Retic Fract: 10.4 % (ref 2.3–15.9)
RBC.: 3.69 MIL/uL — ABNORMAL LOW (ref 4.22–5.81)
Retic Count, Absolute: 32.5 10*3/uL (ref 19.0–186.0)
Retic Ct Pct: 0.9 % (ref 0.4–3.1)

## 2019-02-14 LAB — BRAIN NATRIURETIC PEPTIDE: B Natriuretic Peptide: 30.8 pg/mL (ref 0.0–100.0)

## 2019-02-14 LAB — IRON AND TIBC
Iron: 16 ug/dL — ABNORMAL LOW (ref 45–182)
Saturation Ratios: 10 % — ABNORMAL LOW (ref 17.9–39.5)
TIBC: 155 ug/dL — ABNORMAL LOW (ref 250–450)
UIBC: 139 ug/dL

## 2019-02-14 LAB — HIV ANTIBODY (ROUTINE TESTING W REFLEX): HIV Screen 4th Generation wRfx: NONREACTIVE

## 2019-02-14 LAB — FERRITIN: Ferritin: 628 ng/mL — ABNORMAL HIGH (ref 24–336)

## 2019-02-14 LAB — LACTATE DEHYDROGENASE, PLEURAL OR PERITONEAL FLUID: LD, Fluid: 543 U/L — ABNORMAL HIGH (ref 3–23)

## 2019-02-14 LAB — FOLATE: Folate: 11.9 ng/mL (ref >5.9)

## 2019-02-14 LAB — PROTEIN, PLEURAL OR PERITONEAL FLUID: Total protein, fluid: 5.2 g/dL

## 2019-02-14 LAB — GLUCOSE, PLEURAL OR PERITONEAL FLUID: Glucose, Fluid: 63 mg/dL

## 2019-02-14 LAB — GAMMA GT: GGT: 262 U/L — ABNORMAL HIGH (ref 7–50)

## 2019-02-14 MED ORDER — FERROUS GLUCONATE 324 (38 FE) MG PO TABS
324.0000 mg | ORAL_TABLET | Freq: Every day | ORAL | Status: DC
  Administered 2019-02-15 – 2019-02-16 (×2): 324 mg via ORAL
  Filled 2019-02-14 (×2): qty 1

## 2019-02-14 MED ORDER — LACTATED RINGERS IV SOLN
INTRAVENOUS | Status: DC
  Administered 2019-02-14 – 2019-02-15 (×2): via INTRAVENOUS

## 2019-02-14 MED ORDER — POTASSIUM CHLORIDE CRYS ER 20 MEQ PO TBCR
40.0000 meq | EXTENDED_RELEASE_TABLET | Freq: Once | ORAL | Status: AC
  Administered 2019-02-14: 40 meq via ORAL
  Filled 2019-02-14: qty 2

## 2019-02-14 MED ORDER — LIDOCAINE HCL 1 % IJ SOLN
INTRAMUSCULAR | Status: AC
  Filled 2019-02-14: qty 20

## 2019-02-14 MED ORDER — AZITHROMYCIN 250 MG PO TABS
500.0000 mg | ORAL_TABLET | Freq: Every day | ORAL | Status: DC
  Administered 2019-02-14 – 2019-02-15 (×2): 500 mg via ORAL
  Filled 2019-02-14 (×2): qty 2

## 2019-02-14 MED ORDER — SODIUM CHLORIDE 0.9 % IV SOLN
1.0000 g | INTRAVENOUS | Status: DC
  Administered 2019-02-14 – 2019-02-15 (×2): 1 g via INTRAVENOUS
  Filled 2019-02-14 (×2): qty 10
  Filled 2019-02-14 (×2): qty 1

## 2019-02-14 NOTE — Progress Notes (Signed)
Pharmacy Antibiotic Note  Aaron Greer is a 38 y.o. male admitted on 04/12/2978 with Complicated pleural effusion.  Pharmacy has been consulted for Unasyn dosing.  Plan: Continue Unasyn 3gm iv q6hr No dose adjustments needed, Pharmacy will sign off  Height: 5\' 8"  (172.7 cm) Weight: 143 lb 9.6 oz (65.1 kg) IBW/kg (Calculated) : 68.4  Temp (24hrs), Avg:99.7 F (37.6 C), Min:99 F (37.2 C), Max:101 F (38.3 C)  Recent Labs  Lab 02/13/19 1416 02/13/19 1646 02/14/19 0135  WBC 7.8  --   --   CREATININE 0.83 0.80 0.82  LATICACIDVEN  --  1.4  --     Estimated Creatinine Clearance: 114.7 mL/min (by C-G formula based on SCr of 0.82 mg/dL).    No Known Allergies  Antimicrobials this admission: Unasyn 02/14/2019 >>  Dose adjustments this admission: None  Microbiology results: 4/24 BCx: sent 4/24 COVID: neg 4/25 HIV Ab: IP  Thank you for allowing pharmacy to be a part of this patient's care.  Peggyann Juba, PharmD, BCPS Pager: [phone] 02/14/2019 8:03 AM

## 2019-02-14 NOTE — Progress Notes (Signed)
PROGRESS NOTE    Aaron Greer  QMG:[id number] DOB: Mar 17, 1981 DOA: 02/13/2019 PCP: Patient, No Pcp Per    Brief Narrative:  38 year old male who presented with a pleural effusion.  He has no significant past medical history.  Reported 3 weeks of worsening dyspnea, associated with fevers, chills, pleuritic chest pain and cough.  He was diagnosed as an outpatient with right pleural effusion, sent to the hospital for further evaluation.  On his initial physical examination blood pressure 109/70, pulse rate 101, respiratory rate 28, temperature 99.4-101 F, oxygen saturation 96%.  He had moist mucous membranes, heart S1-S2 present and rhythmic, his lungs show decreased breath sounds at the right side, no increased work of breathing, vesicular sounds on the left, abdomen soft, no lower extremity edema.  Sodium 135, potassium 3.4, chloride 101, bicarb 23, glucose 86, BUN 8, creatinine 0.8, white cell count 7.8, hemoglobin 12.3, hematocrit 37.9, platelets 549.  D-dimer 11, fibrinogen more than 800.  CT chest with a large probably loculated right pleural effusion with compression atelectasis of the right lower and middle lobes, no pulmonary embolism.  His EKG had 95 bpm, normal axis, normal intervals, sinus with normal conduction, no ST segment or T wave changes.  Patient was admitted to the hospital with the working diagnosis of right pleural effusion/ likely para-pneumonic    Assessment & Plan:   Principal Problem:   Pleural effusion Active Problems:   Normocytic anemia   Thrombocytosis (HCC)   Elevated alkaline phosphatase level   Hypokalemia  1. Right lower lobe community acquired pneumonia, complicated with large right para-pneumonic effusion. Will continue antibiotic therapy with ceftriaxone and azithromycin, patient with low pretest probability for aspiration dc Unasyn. Will continue oxymetry monitoring, as needed supplemental oxygen per [place] and anti tussive. Patient tested negative for COVID 19.  Will check streptococcal and legionella urine antigen. For thoracentesis today, follow on fluid analysis and cultures. Patient has remained afebrile with no leukocytosis, cultures continue with no growth. Check HIV.   2. Hypokalemia. Will continue K correction with Kcl, will allow patient to eat, follow on renal panel in am. Will add gentle IV fluids with balanced electrolyte solutions.   3. Iron deficiency anemia with reactive thrombocytosis. Serum iron is 16 with transferrin saturation of 10, elevated ferrin as acute phase reactant. Will start patient on po Iron.   4. Hypoalbuminemia. Suspected calorie protein malnutrition, unspecified severity, will consult nutrition for further recommendations.    DVT prophylaxis: enoxaparin   Code Status:  full Family Communication:  No family at the bedside  Disposition Plan/ discharge barriers: pending clinical improvement   Body mass index is 21.83 kg/m. Malnutrition Type:      Malnutrition Characteristics:      Nutrition Interventions:     RN Pressure Injury Documentation:     Consultants:     Procedures:     Antimicrobials:   Ceftriaxone   Azithromycin.     Subjective: Patient continue to have dyspnea and pleuritic chest pain on the right, he did not received antibiotic therapy as outpatient. No nausea or vomiting.   Objective: Vitals:   02/13/19 2130 02/13/19 2210 02/13/19 2214 02/14/19 0523  BP: 110/70  113/71 102/70  Pulse: (!) 108  (!) 101 98  Resp: 17  14 (!) 23  Temp:   99.4 F (37.4 C) 99.3 F (37.4 C)  TempSrc:   Oral Oral  SpO2: 96%  97% 95%  Weight:  65.1 kg    Height:  5\' 8"  (1.727 m)  Intake/Output Summary (Last 24 hours) at 02/14/2019 1008 Last data filed at 02/14/2019 0900 Gross per 24 hour  Intake 704.36 ml  Output 200 ml  Net 504.36 ml   Filed Weights   02/13/19 2210  Weight: 65.1 kg    Examination:   General: deconditioned  Neurology: Awake and alert, non focal  E ENT: no  pallor, no icterus, oral mucosa dry.  Cardiovascular: No JVD. S1-S2 present, rhythmic, no gallops, rubs, or murmurs. No lower extremity edema. Pulmonary: Decreased breath sounds at the right side, dull to percussion, positive right base rales with no wheezing, or rhonchi. Left side with vesicular breath sounds.  Gastrointestinal. Abdomen with no organomegaly, non tender, no rebound or guarding Skin. No rashes Musculoskeletal: no joint deformities     Data Reviewed: I have personally reviewed following labs and imaging studies  CBC: Recent Labs  Lab 02/13/19 1416  WBC 7.8  NEUTROABS 5.8  HGB 12.3*  HCT 37.9*  MCV 96.7  PLT 419*   Basic Metabolic Panel: Recent Labs  Lab 02/13/19 1416 02/13/19 1646 02/13/19 1700 02/14/19 0135  NA 137 135  --  135  K 3.9 3.4*  --  3.6  CL 102 101  --  102  CO2 27 23  --  24  GLUCOSE 95 86  --  113*  BUN 8 8  --  10  CREATININE 0.83 0.80  --  0.82  CALCIUM 8.4* 8.4*  --  8.1*  MG  --   --  2.3  --    GFR: Estimated Creatinine Clearance: 114.7 mL/min (by C-G formula based on SCr of 0.82 mg/dL). Liver Function Tests: Recent Labs  Lab 02/13/19 1646  AST 23  ALT 43  ALKPHOS 134*  BILITOT 0.6  PROT 7.8  ALBUMIN 2.8*   No results for input(s): LIPASE, AMYLASE in the last 168 hours. No results for input(s): AMMONIA in the last 168 hours. Coagulation Profile: No results for input(s): INR, PROTIME in the last 168 hours. Cardiac Enzymes: No results for input(s): CKTOTAL, CKMB, CKMBINDEX, TROPONINI in the last 168 hours. BNP (last 3 results) No results for input(s): PROBNP in the last 8760 hours. HbA1C: No results for input(s): HGBA1C in the last 72 hours. CBG: No results for input(s): GLUCAP in the last 168 hours. Lipid Profile: Recent Labs    02/13/19 1646  TRIG 71   Thyroid Function Tests: No results for input(s): TSH, T4TOTAL, FREET4, T3FREE, THYROIDAB in the last 72 hours. Anemia Panel: Recent Labs    02/13/19 1646  02/14/19 0135  VITAMINB12  --  1,115*  FOLATE  --  11.9  FERRITIN 719* 628*  TIBC  --  155*  IRON  --  16*  RETICCTPCT  --  0.9      Radiology Studies: I have reviewed all of the imaging during this hospital visit personally     Scheduled Meds: Continuous Infusions: . ampicillin-sulbactam (UNASYN) IV 3 g (02/14/19 0601)     LOS: 0 days         Gerome Apley, MD

## 2019-02-14 NOTE — Procedures (Signed)
PROCEDURE SUMMARY:  Successful image-guided right thoracentesis. Yielded 1.1 liters of amber fluid. Patient tolerated procedure well. EBL: Zero No immediate complications.  Specimen was sent for labs. Post procedure CXR shows no pneumothorax.  Please see imaging section of Epic for full dictation.  Joaquim Nam PA-C 02/14/2019 12:08 PM

## 2019-02-15 DIAGNOSIS — E611 Iron deficiency: Secondary | ICD-10-CM

## 2019-02-15 LAB — BASIC METABOLIC PANEL
Anion gap: 10 (ref 5–15)
BUN: 7 mg/dL (ref 6–20)
CO2: 24 mmol/L (ref 22–32)
Calcium: 8.3 mg/dL — ABNORMAL LOW (ref 8.9–10.3)
Chloride: 103 mmol/L (ref 98–111)
Creatinine, Ser: 0.77 mg/dL (ref 0.61–1.24)
GFR calc Af Amer: >60 mL/min (ref >60)
GFR calc non Af Amer: >60 mL/min (ref >60)
Glucose, Bld: 90 mg/dL (ref 70–99)
Potassium: 3.5 mmol/L (ref 3.5–5.1)
Sodium: 137 mmol/L (ref 135–145)

## 2019-02-15 LAB — CBC WITH DIFFERENTIAL/PLATELET
Abs Immature Granulocytes: 0.05 10*3/uL (ref 0.00–0.07)
Basophils Absolute: 0.1 10*3/uL (ref 0.0–0.1)
Basophils Relative: 1 %
Eosinophils Absolute: 0.3 10*3/uL (ref 0.0–0.5)
Eosinophils Relative: 3 %
HCT: 36.4 % — ABNORMAL LOW (ref 39.0–52.0)
Hemoglobin: 11.7 g/dL — ABNORMAL LOW (ref 13.0–17.0)
Immature Granulocytes: 1 %
Lymphocytes Relative: 19 %
Lymphs Abs: 1.8 10*3/uL (ref 0.7–4.0)
MCH: 31.2 pg (ref 26.0–34.0)
MCHC: 32.1 g/dL (ref 30.0–36.0)
MCV: 97.1 fL (ref 80.0–100.0)
Monocytes Absolute: 1 10*3/uL (ref 0.1–1.0)
Monocytes Relative: 11 %
Neutro Abs: 5.9 10*3/uL (ref 1.7–7.7)
Neutrophils Relative %: 65 %
Platelets: 528 10*3/uL — ABNORMAL HIGH (ref 150–400)
RBC: 3.75 MIL/uL — ABNORMAL LOW (ref 4.22–5.81)
RDW: 12.9 % (ref 11.5–15.5)
WBC: 9.1 10*3/uL (ref 4.0–10.5)
nRBC: 0 % (ref 0.0–0.2)

## 2019-02-15 LAB — GRAM STAIN

## 2019-02-15 LAB — RAPID HIV SCREEN (HIV 1/2 AB+AG)
HIV 1/2 Antibodies: NONREACTIVE
HIV-1 P24 Antigen - HIV24: NONREACTIVE

## 2019-02-15 LAB — STREP PNEUMONIAE URINARY ANTIGEN: Strep Pneumo Urinary Antigen: NEGATIVE

## 2019-02-15 MED ORDER — GUAIFENESIN-DM 100-10 MG/5ML PO SYRP: 5.0000 mL | ORAL_SOLUTION | ORAL | Status: DC | PRN

## 2019-02-15 MED ORDER — POTASSIUM CHLORIDE CRYS ER 20 MEQ PO TBCR
20.0000 meq | EXTENDED_RELEASE_TABLET | Freq: Once | ORAL | Status: AC
  Administered 2019-02-15: 20 meq via ORAL
  Filled 2019-02-15: qty 1

## 2019-02-15 NOTE — Progress Notes (Signed)
PROGRESS NOTE    Quintel Mccalla  HAL:[id number] DOB: Dec 07, 1980 DOA: 02/13/2019 PCP: Patient, No Pcp Per    Brief Narrative:  38 year old male who presented with a pleural effusion.  He has no significant past medical history.  Reported 3 weeks of worsening dyspnea, associated with fevers, chills, pleuritic chest pain and cough.  He was diagnosed as an outpatient with right pleural effusion, sent to the hospital for further evaluation.  On his initial physical examination blood pressure 109/70, pulse rate 101, respiratory rate 28, temperature 99.4-101 F, oxygen saturation 96%.  He had moist mucous membranes, heart S1-S2 present and rhythmic, his lungs show decreased breath sounds at the right side, no increased work of breathing, vesicular sounds on the left, abdomen soft, no lower extremity edema.  Sodium 135, potassium 3.4, chloride 101, bicarb 23, glucose 86, BUN 8, creatinine 0.8, white cell count 7.8, hemoglobin 12.3, hematocrit 37.9, platelets 549.  D-dimer 11, fibrinogen more than 800.  CT chest with a large probably loculated right pleural effusion with compression atelectasis of the right lower and middle lobes, no pulmonary embolism.  His EKG had 95 bpm, normal axis, normal intervals, sinus with normal conduction, no ST segment or T wave changes.  Patient was admitted to the hospital with the working diagnosis of right pleural effusion/ likely para-pneumonic     Assessment & Plan:   Principal Problem:   Pleural effusion Active Problems:   Normocytic anemia   Thrombocytosis (HCC)   Elevated alkaline phosphatase level   Hypokalemia   Pneumonia  1. Right lower lobe community acquired pneumonia, complicated with large right para-pneumonic effusion. Patient has remained afebrile, no leukocytosis and cultures have remained with no growth. COVID 19 negative, urine streptococcal antigen negative, HIV negative. Chest film with improved infiltrate at the right base with small residual  effusion (personally reviewed). Patient continue to have cough. His oxygen saturation is 95 to 96 on room air. Add guaifenesin DM. Clinically not yet back to baseline.   Plural fluid yellow with 2,473 wbc, no organisms seen on gram stain, exudate per Light's criteria. Culture is pending.   Will dc telemetry, out of bed to chair tid with meals and ambulate in the hallway.   2. Hypokalemia. His K is 3,5, will continue correction with po Kcl.  3. Iron deficiency anemia with reactive thrombocytosis. Serum iron is 16 with transferrin saturation of 10. Continue po iron supplementation for now.   4. Hypoalbuminemia. Consulted nutrition for recommendations.   DVT prophylaxis: enoxaparin   Code Status:  full Family Communication:  No family at the bedside  Disposition Plan/ discharge barriers: possible dc in am.   Body mass index is 21.83 kg/m. Malnutrition Type:      Malnutrition Characteristics:      Nutrition Interventions:     RN Pressure Injury Documentation:     Consultants:     Procedures:     Antimicrobials:   Azithromycin  Ceftriaxone    Subjective: Patient continue to have cough, dyspnea has improved but he is not yet back to baseline, no nausea or vomiting, and improved appetite.   Objective: Vitals:   02/14/19 1202 02/14/19 1335 02/14/19 2232 02/15/19 0444  BP: 106/69 116/80 101/71 94/66  Pulse:  80 92 81  Resp:  18 16 16   Temp:  97.9 F (36.6 C) 99.3 F (37.4 C) 98.6 F (37 C)  TempSrc:  Oral Oral Oral  SpO2:  100% 95% 96%  Weight:      Height:  Intake/Output Summary (Last 24 hours) at 02/15/2019 1027 Last data filed at 02/15/2019 0740 Gross per 24 hour  Intake 2237.97 ml  Output -  Net 2237.97 ml   Filed Weights   02/13/19 2210  Weight: 65.1 kg    Examination:   General: deconditioned  Neurology: Awake and alert, non focal  E ENT: mild pallor, no icterus, oral mucosa moist Cardiovascular: No JVD. S1-S2 present,  rhythmic, no gallops, rubs, or murmurs. No lower extremity edema. Pulmonary: positive breath sounds bilaterally, adequate air movement, no wheezing, or rhonchi, positive rales at the right base. Gastrointestinal. Abdomen with no organomegaly, non tender, no rebound or guarding Skin. No rashes Musculoskeletal: no joint deformities     Data Reviewed: I have personally reviewed following labs and imaging studies  CBC: Recent Labs  Lab 02/13/19 1416 02/15/19 0510  WBC 7.8 9.1  NEUTROABS 5.8 5.9  HGB 12.3* 11.7*  HCT 37.9* 36.4*  MCV 96.7 97.1  PLT 549* 229*   Basic Metabolic Panel: Recent Labs  Lab 02/13/19 1416 02/13/19 1646 02/13/19 1700 02/14/19 0135 02/15/19 0510  NA 137 135  --  135 137  K 3.9 3.4*  --  3.6 3.5  CL 102 101  --  102 103  CO2 27 23  --  24 24  GLUCOSE 95 86  --  113* 90  BUN 8 8  --  10 7  CREATININE 0.83 0.80  --  0.82 0.77  CALCIUM 8.4* 8.4*  --  8.1* 8.3*  MG  --   --  2.3  --   --    GFR: Estimated Creatinine Clearance: 117.5 mL/min (by C-G formula based on SCr of 0.77 mg/dL). Liver Function Tests: Recent Labs  Lab 02/13/19 1646  AST 23  ALT 43  ALKPHOS 134*  BILITOT 0.6  PROT 7.8  ALBUMIN 2.8*   No results for input(s): LIPASE, AMYLASE in the last 168 hours. No results for input(s): AMMONIA in the last 168 hours. Coagulation Profile: No results for input(s): INR, PROTIME in the last 168 hours. Cardiac Enzymes: No results for input(s): CKTOTAL, CKMB, CKMBINDEX, TROPONINI in the last 168 hours. BNP (last 3 results) No results for input(s): PROBNP in the last 8760 hours. HbA1C: No results for input(s): HGBA1C in the last 72 hours. CBG: No results for input(s): GLUCAP in the last 168 hours. Lipid Profile: Recent Labs    02/13/19 1646  TRIG 71   Thyroid Function Tests: No results for input(s): TSH, T4TOTAL, FREET4, T3FREE, THYROIDAB in the last 72 hours. Anemia Panel: Recent Labs    02/13/19 1646 02/14/19 0135  VITAMINB12   --  1,115*  FOLATE  --  11.9  FERRITIN 719* 628*  TIBC  --  155*  IRON  --  16*  RETICCTPCT  --  0.9      Radiology Studies: I have reviewed all of the imaging during this hospital visit personally     Scheduled Meds: . azithromycin  500 mg Oral QAC supper  . ferrous gluconate  324 mg Oral Q breakfast   Continuous Infusions: . cefTRIAXone (ROCEPHIN)  IV 1 g (02/14/19 1330)  . lactated ringers 75 mL/hr at 02/15/19 0358     LOS: 1 day        Otha Monical Gerome Apley, MD

## 2019-02-15 NOTE — Progress Notes (Signed)
Patient ambulated in the hall. No C/O SOB, dizziness, or weakness. Patient reports feeling much better as before he was not able to ambulate >49ft without getting short of breath. Patient endorses feeling overall better today.

## 2019-02-16 ENCOUNTER — Encounter (HOSPITAL_COMMUNITY): Payer: Self-pay | Admitting: *Deleted

## 2019-02-16 LAB — CBC WITH DIFFERENTIAL/PLATELET
Abs Immature Granulocytes: 0.04 10*3/uL (ref 0.00–0.07)
Basophils Absolute: 0.1 10*3/uL (ref 0.0–0.1)
Basophils Relative: 1 %
Eosinophils Absolute: 0.3 10*3/uL (ref 0.0–0.5)
Eosinophils Relative: 4 %
HCT: 40 % (ref 39.0–52.0)
Hemoglobin: 12.9 g/dL — ABNORMAL LOW (ref 13.0–17.0)
Immature Granulocytes: 1 %
Lymphocytes Relative: 22 %
Lymphs Abs: 1.6 10*3/uL (ref 0.7–4.0)
MCH: 31.4 pg (ref 26.0–34.0)
MCHC: 32.3 g/dL (ref 30.0–36.0)
MCV: 97.3 fL (ref 80.0–100.0)
Monocytes Absolute: 0.9 10*3/uL (ref 0.1–1.0)
Monocytes Relative: 12 %
Neutro Abs: 4.5 10*3/uL (ref 1.7–7.7)
Neutrophils Relative %: 60 %
Platelets: 546 10*3/uL — ABNORMAL HIGH (ref 150–400)
RBC: 4.11 MIL/uL — ABNORMAL LOW (ref 4.22–5.81)
RDW: 12.7 % (ref 11.5–15.5)
WBC: 7.3 10*3/uL (ref 4.0–10.5)
nRBC: 0 % (ref 0.0–0.2)

## 2019-02-16 LAB — BASIC METABOLIC PANEL
Anion gap: 10 (ref 5–15)
BUN: 9 mg/dL (ref 6–20)
CO2: 27 mmol/L (ref 22–32)
Calcium: 8.7 mg/dL — ABNORMAL LOW (ref 8.9–10.3)
Chloride: 101 mmol/L (ref 98–111)
Creatinine, Ser: 0.88 mg/dL (ref 0.61–1.24)
GFR calc Af Amer: >60 mL/min (ref >60)
GFR calc non Af Amer: >60 mL/min (ref >60)
Glucose, Bld: 92 mg/dL (ref 70–99)
Potassium: 4.1 mmol/L (ref 3.5–5.1)
Sodium: 138 mmol/L (ref 135–145)

## 2019-02-16 LAB — LEGIONELLA PNEUMOPHILA SEROGP 1 UR AG: L. pneumophila Serogp 1 Ur Ag: NEGATIVE

## 2019-02-16 LAB — PH, BODY FLUID: pH, Body Fluid: 7.5

## 2019-02-16 MED ORDER — GUAIFENESIN-DM 100-10 MG/5ML PO SYRP: 5.0000 mL | ORAL_SOLUTION | Freq: Four times a day (QID) | ORAL | 0 refills | Status: DC | PRN

## 2019-02-16 MED ORDER — ACETAMINOPHEN 500 MG PO TABS: 500.0000 mg | ORAL_TABLET | Freq: Four times a day (QID) | ORAL | 0 refills | Status: DC | PRN

## 2019-02-16 MED ORDER — AMOXICILLIN-POT CLAVULANATE 875-125 MG PO TABS: 1.0000 | ORAL_TABLET | Freq: Two times a day (BID) | ORAL | Status: DC

## 2019-02-16 MED ORDER — AMOXICILLIN-POT CLAVULANATE 875-125 MG PO TABS: 1.0000 | ORAL_TABLET | Freq: Two times a day (BID) | ORAL | 0 refills | Status: AC

## 2019-02-16 MED ORDER — ENSURE ENLIVE PO LIQD: 237.0000 mL | ORAL | Status: DC

## 2019-02-16 MED ORDER — BOOST / RESOURCE BREEZE PO LIQD CUSTOM
1.0000 | Freq: Two times a day (BID) | ORAL | Status: DC
  Administered 2019-02-16: 1 via ORAL

## 2019-02-16 MED ORDER — FERROUS GLUCONATE 324 (38 FE) MG PO TABS: 324.0000 mg | ORAL_TABLET | Freq: Every day | ORAL | 0 refills | Status: DC

## 2019-02-16 MED ORDER — ADULT MULTIVITAMIN W/MINERALS CH
1.0000 | ORAL_TABLET | Freq: Every day | ORAL | Status: DC
  Administered 2019-02-16: 1 via ORAL
  Filled 2019-02-16: qty 1

## 2019-02-16 MED FILL — AMOX-CLAV 875-125 MG TABLET: 875-125 | 7 days supply | Qty: 14 | Fill #0

## 2019-02-16 NOTE — TOC Transition Note (Signed)
Transition of Care [facility]) - CM/SW Discharge Note   Patient Details  Name: Aaron Greer MRN: [id number] Date of Birth: 1980/12/30  [facility] CM/SW Contact:  Dessa Phi, RN Phone Number: 02/16/2019, 11:33 AM   Clinical Narrative: CM referral for med asst. Patient states he has health insurance-[employer]-he was unable to provide insurance card. CM contacted admitting-they do not have a record of health insurance.Informed patient to contact his employer HR dept-patient voiced understanding. Currently listed as medicaid potential-provided patient w/[facility] for pcp-appt set.[facility] pharmacy for 1x med refill with appt-noted on d/c instructions. Patient voiced understanding. No further CM needs.    Final next level of care: Home/Self Care Barriers to Discharge: No Barriers Identified   Patient Goals and CMS Choice        Discharge Placement                       Discharge Plan and Services   Discharge Planning Services: CM Consult, [facility], Medication Assistance                                 Social Determinants of Health (SDOH) Interventions     Readmission Risk Interventions No flowsheet data found.

## 2019-02-16 NOTE — Discharge Summary (Signed)
Physician Discharge Summary  Aaron Greer HFW:[id number] DOB: 20-Aug-1981 DOA: 02/13/2019  PCP: Patient, No Pcp Per  Admit date: 02/13/2019 Discharge date: 02/16/2019  Admitted From: Home  Disposition:  Home   Recommendations for Outpatient Follow-up and new medication changes:  1. Follow up with Primary Care in 7 days.  2. Continue antibiotic therapy with po Augmentin for 7 days. 3. Added guaifenesin as needed for cough. 4. Acetaminophen as needed for pain.   Home Health: no   Equipment/Devices: no    Discharge Condition: stable  CODE STATUS: full  Diet recommendation: Regular.   Brief/Interim Summary: 38 year old male who presented with a pleural effusion. He has no significant past medical history. Reported 3 weeks of worsening dyspnea,associated with fevers,chills, pleuritic chest pain and cough. He was diagnosed as an outpatient with right pleural effusion, sent to the hospital for further evaluation. On his initial physical examination his blood pressure was 109/70, pulse rate 101, respiratory rate 28, temperature 99.4-101 F, oxygen saturation 96%. He had moist mucous membranes, heart S1-S2 present and rhythmic, his lungs show decreased breath sounds at the right side, no increased work of breathing, vesicular sounds on the left, abdomen soft, no lower extremity edema.Sodium 135, potassium 3.4, chloride 101, bicarb 23, glucose 86, BUN 8, creatinine 0.8, white cell count 7.8, hemoglobin 12.3, hematocrit 37.9, platelets 549.D-dimer 11, fibrinogen more than 800.CT chest with a large probably loculated right pleural effusion with compression atelectasis of the right lower and middle lobes, no pulmonary embolism.His EKG had 95 bpm, normal axis, normal intervals, sinus with normal conduction, no ST segment or T wave changes.  Patient was admitted to the hospital with the working diagnosis of right pleural effusion/ likely para-pneumonic  1.  Right lower lobe  community-acquired pneumonia, complicated by a large right parapneumonic pleural effusion.  He was admitted to the medical ward, he was placed on a remote telemetry monitor, received antibiotic therapy with IV ceftriaxone and p.o. azithromycin.  He underwent ultrasound-guided thoracentesis and 1.1 L of amber fluid was removed, with no complications.  He responded well to the medical therapy, his blood/ pleural cultures have remained no growth, SARS COVID-19 was negative, his urine streptococcal antigen was negative, HIV serology was nonreactive.  His discharge white cell count is 7.3.  He has remained afebrile for the last 12 hours.   2.  Hypokalemia.  Patient received potassium chloride with good toleration.  3.  Iron deficiency anemia with reactive thrombocytosis.  Serum iron 16, transferrin saturation 10, TIBC 155, ferritin 628.  Patient will be placed on oral iron supplements, follow-up as an outpatient.  4.  Protein calorie malnutrition/unspecified severity.  Patient was noted to have hypoalbuminemia, nutrition was consulted and he was placed on nutritional supplements.  Discharge Diagnoses:  Principal Problem:   Pleural effusion Active Problems:   Normocytic anemia   Thrombocytosis (HCC)   Elevated alkaline phosphatase level   Hypokalemia   Pneumonia    Discharge Instructions   Allergies as of 02/16/2019   No Known Allergies     Medication List    STOP taking these medications   benzonatate 100 MG capsule Commonly known as:  TESSALON   ondansetron 8 MG disintegrating tablet Commonly known as:  ZOFRAN-ODT   promethazine-dextromethorphan 6.25-15 MG/5ML syrup Commonly known as:  PROMETHAZINE-DM     TAKE these medications   acetaminophen 500 MG tablet Commonly known as:  TYLENOL Take 1 tablet (500 mg total) by mouth every 6 (six) hours as needed for moderate pain (or Fever >/=  101).   amoxicillin-clavulanate 875-125 MG tablet Commonly known as:  AUGMENTIN Take 1 tablet  by mouth every 12 (twelve) hours for 7 days.   ferrous gluconate 324 MG tablet Commonly known as:  FERGON Take 1 tablet (324 mg total) by mouth daily with breakfast for 30 days. Start taking on:  February 17, 2019   guaiFENesin-dextromethorphan 100-10 MG/5ML syrup Commonly known as:  ROBITUSSIN DM Take 5 mLs by mouth every 6 (six) hours as needed for cough.       No Known Allergies  Consultations:  IR   Procedures/Studies: Dg Chest 1 View  Result Date: 02/14/2019 CLINICAL DATA:  Post RIGHT thoracentesis (1.1 L of pleural fluid removed). EXAM: CHEST  1 VIEW COMPARISON:  CT chest 02/13/2019. FINDINGS: Interval marked reduction in the RIGHT pleural effusion post thoracentesis, with a small loculated effusion persisting. No pneumothorax. Marked improvement in aeration in the RIGHT lung with mild passive atelectasis persisting in the RIGHT LOWER LOBE. LEFT lung remains clear. Cardiomediastinal silhouette unremarkable. IMPRESSION: 1. Marked reduction in the RIGHT pleural effusion post thoracentesis, with a small loculated effusion persisting. No pneumothorax. 2. Marked improvement in aeration in the RIGHT lung with mild passive atelectasis persisting in the RIGHT LOWER LOBE. Electronically Signed   By: Evangeline Dakin M.D.   On: 02/14/2019 13:08   Ct Chest W Contrast  Result Date: 02/13/2019 CLINICAL DATA:  Pleural effusion. EXAM: CT CHEST WITH CONTRAST TECHNIQUE: Multidetector CT imaging of the chest was performed during intravenous contrast administration. CONTRAST:  24mL OMNIPAQUE IOHEXOL 300 MG/ML  SOLN COMPARISON:  None. FINDINGS: Cardiovascular: No significant vascular findings. Normal heart size. No pericardial effusion. Mediastinum/Nodes: No enlarged mediastinal, hilar, or axillary lymph nodes. Thyroid gland, trachea, and esophagus demonstrate no significant findings. Lungs/Pleura: No pneumothorax is noted. Left lung is clear. Large probably loculated right pleural effusion is noted  resulting in atelectasis of the right lung, particularly the right lower and middle lobes. Upper Abdomen: No acute abnormality. Musculoskeletal: No chest wall abnormality. No acute or significant osseous findings. IMPRESSION: Large probably loculated right pleural effusion is noted resulting in significant atelectasis of the right lower and middle lobes. Electronically Signed   By: Marijo Conception M.D.   On: 02/13/2019 16:00   Ct Angio Chest Pe W Or Wo Contrast  Result Date: 02/13/2019 CLINICAL DATA:  Shortness of breath, elevated D-dimer EXAM: CT ANGIOGRAPHY CHEST WITH CONTRAST TECHNIQUE: Multidetector CT imaging of the chest was performed using the standard protocol during bolus administration of intravenous contrast. Multiplanar CT image reconstructions and MIPs were obtained to evaluate the vascular anatomy. CONTRAST:  184mL OMNIPAQUE IOHEXOL 350 MG/ML SOLN COMPARISON:  Chest CT 02/13/2019 FINDINGS: Cardiovascular: Satisfactory opacification of the pulmonary arteries to the segmental level. No evidence of pulmonary embolism. Normal heart size. No pericardial effusion. Nonaneurysmal aorta. No dissection is seen. Mediastinum/Nodes: Midline trachea. No thyroid mass. Subcarinal lymph node measuring up to 16 mm. Esophagus within normal limits. Lungs/Pleura: Large loculated right pleural effusion with compressive right lung atelectasis. The left lung is clear. Upper Abdomen: No acute abnormality. Musculoskeletal: No chest wall abnormality. No acute or significant osseous findings. Review of the MIP images confirms the above findings. IMPRESSION: 1. Negative for acute pulmonary embolus or aortic dissection. 2. Large loculated right pleural effusion with compressive atelectasis of the right lung Electronically Signed   By: Donavan Foil M.D.   On: 02/13/2019 22:23   US Thoracentesis Asp Pleural Space W/img Guide  Result Date: 02/14/2019 INDICATION: Patient with no significant PMH  and worsening dyspnea x 3 weeks;  COVID negative who presented to urgent care yesterday due to worsening dyspnea - CXR showed large right pleural effusion. Patient was sent to ED for further evaluation and management. Request has been made to IR for diagnostic and therapeutic thoracentesis. EXAM: ULTRASOUND GUIDED RIGHT THORACENTESIS MEDICATIONS: 7 mL 1% lidocaine COMPLICATIONS: None immediate. PROCEDURE: An ultrasound guided thoracentesis was thoroughly discussed with the patient and questions answered. The benefits, risks, alternatives and complications were also discussed. The patient understands and wishes to proceed with the procedure. Written consent was obtained. Ultrasound was performed to localize and mark an adequate pocket of fluid in the right chest. The area was then prepped and draped in the normal sterile fashion. 1% Lidocaine was used for local anesthesia. Under ultrasound guidance a 6 Fr Safe-T-Centesis catheter was introduced. Thoracentesis was performed. The catheter was removed and a dressing applied. FINDINGS: A total of approximately 1.1 L of amber fluid was removed. Samples were sent to the laboratory as requested by the clinical team. IMPRESSION: Successful ultrasound guided right thoracentesis yielding 1.1 L of pleural fluid. Read by Candiss Norse, PA-C Electronically Signed   By: Aletta Edouard M.D.   On: 02/14/2019 13:27      Procedures: right US guided thoracentesis  Subjective: Patient is feeling better, no nausea or vomiting, continue to improve dyspnea, no chest pain.   Discharge Exam: Vitals:   02/15/19 2242 02/16/19 0541  BP:  99/68  Pulse:  88  Resp:  20  Temp: 100.1 F (37.8 C) 98.6 F (37 C)  SpO2:  96%   Vitals:   02/15/19 1417 02/15/19 2127 02/15/19 2242 02/16/19 0541  BP: 101/68 (!) 99/95  99/68  Pulse: 90 92  88  Resp: 16 18  20   Temp: 98.9 F (37.2 C) (!) 102.2 F (39 C) 100.1 F (37.8 C) 98.6 F (37 C)  TempSrc: Oral Oral Oral Oral  SpO2: 97% 94%  96%  Weight:       Height:        General: Not in pain or dyspnea  Neurology: Awake and alert, non focal  E ENT: no pallor, no icterus, oral mucosa moist Cardiovascular: No JVD. S1-S2 present, rhythmic, no gallops, rubs, or murmurs. No lower extremity edema. Pulmonary: positive breath sounds bilaterally, adequate air movement, no wheezing, no rhonchi, mild rales at the right base. Gastrointestinal. Abdomen with no organomegaly, non tender, no rebound or guarding Skin. No rashes Musculoskeletal: no joint deformities   The results of significant diagnostics from this hospitalization (including imaging, microbiology, ancillary and laboratory) are listed below for reference.     Microbiology: Recent Results (from the past 240 hour(s))  SARS Coronavirus 2 [facility] order, Performed in [facility] hospital lab)     Status: None   Collection Time: 02/13/19  4:46 PM  Result Value Ref Range Status   SARS Coronavirus 2 NEGATIVE NEGATIVE Final    Comment: (NOTE) If result is NEGATIVE SARS-CoV-2 target nucleic acids are NOT DETECTED. The SARS-CoV-2 RNA is generally detectable in upper and lower  respiratory specimens during the acute phase of infection. The lowest  concentration of SARS-CoV-2 viral copies this assay can detect is 250  copies / mL. A negative result does not preclude SARS-CoV-2 infection  and should not be used as the sole basis for treatment or other  patient management decisions.  A negative result may occur with  improper specimen collection / handling, submission of specimen other  than nasopharyngeal swab, presence of  viral mutation(s) within the  areas targeted by this assay, and inadequate number of viral copies  (<250 copies / mL). A negative result must be combined with clinical  observations, patient history, and epidemiological information. If result is POSITIVE SARS-CoV-2 target nucleic acids are DETECTED. The SARS-CoV-2 RNA is generally detectable in upper and lower   respiratory specimens dur ing the acute phase of infection.  Positive  results are indicative of active infection with SARS-CoV-2.  Clinical  correlation with patient history and other diagnostic information is  necessary to determine patient infection status.  Positive results do  not rule out bacterial infection or co-infection with other viruses. If result is PRESUMPTIVE POSTIVE SARS-CoV-2 nucleic acids MAY BE PRESENT.   A presumptive positive result was obtained on the submitted specimen  and confirmed on repeat testing.  While 2019 novel coronavirus  (SARS-CoV-2) nucleic acids may be present in the submitted sample  additional confirmatory testing may be necessary for epidemiological  and / or clinical management purposes  to differentiate between  SARS-CoV-2 and other Sarbecovirus currently known to infect humans.  If clinically indicated additional testing with an alternate test  methodology [phone]) is advised. The SARS-CoV-2 RNA is generally  detectable in upper and lower respiratory sp ecimens during the acute  phase of infection. The expected result is Negative. Fact Sheet for Patients:  StrictlyIdeas.no Fact Sheet for Healthcare Providers: BankingDealers.co.za This test is not yet approved or cleared by the [place] FDA and has been authorized for detection and/or diagnosis of SARS-CoV-2 by FDA under an Emergency Use Authorization (EUA).  This EUA will remain in effect (meaning this test can be used) for the duration of the COVID-19 declaration under Section 564(b)(1) of the Act, 21 U.S.C. section 360bbb-3(b)(1), unless the authorization is terminated or revoked sooner. Performed at [facility], [place] [address], [place], [place] [address]   Blood Culture (routine x 2)     Status: None (Preliminary result)   Collection Time: 02/13/19  4:46 PM  Result Value Ref Range Status   Specimen Description    Final    BLOOD LEFT ANTECUBITAL Performed at [place] [address] [place], [place] [address]    Special Requests   Final    BOTTLES DRAWN AEROBIC AND ANAEROBIC Blood Culture adequate volume Performed at [place] [address] [place], [place] [address]    Culture   Final    NO GROWTH 3 DAYS Performed at [facility], [place] [address], [place], [place] [address]    Report Status PENDING  Incomplete  Blood Culture (routine x 2)     Status: None (Preliminary result)   Collection Time: 02/13/19  4:46 PM  Result Value Ref Range Status   Specimen Description   Final    BLOOD RIGHT FOREARM Performed at [facility], [place] [address] [place], [place] [address]    Special Requests   Final    BOTTLES DRAWN AEROBIC AND ANAEROBIC Blood Culture adequate volume Performed at [place] [address], [place], [place] [address]    Culture   Final    NO GROWTH 3 DAYS Performed at [facility], [place] [address], [place], [place] [address]    Report Status PENDING  Incomplete  Gram stain     Status: None   Collection Time: 02/14/19 12:31 PM  Result Value Ref Range Status   Specimen Description PLEURAL RIGHT  Final  Special Requests NONE  Final   Gram Stain   Final    RARE WBC PRESENT, PREDOMINANTLY MONONUCLEAR NO ORGANISMS SEEN Performed at [facility], [place] [address], [place], [place] [address]    Report Status 02/15/2019 FINAL  Final  Culture, body fluid-bottle     Status: None (Preliminary result)   Collection Time: 02/14/19 12:31 PM  Result Value Ref Range Status   Specimen Description PLEURAL RIGHT  Final   Special Requests NONE  Final   Culture   Final    NO GROWTH 2 DAYS Performed at [place] [address], [place], [place] [address]    Report Status PENDING  Incomplete     Labs: BNP (last 3 results) Recent Labs    02/14/19 0135  BNP 53.6   Basic Metabolic  Panel: Recent Labs  Lab 02/13/19 1416 02/13/19 1646 02/13/19 1700 02/14/19 0135 02/15/19 0510 02/16/19 0507  NA 137 135  --  135 137 138  K 3.9 3.4*  --  3.6 3.5 4.1  CL 102 101  --  102 103 101  CO2 27 23  --  24 24 27   GLUCOSE 95 86  --  113* 90 92  BUN 8 8  --  10 7 9   CREATININE 0.83 0.80  --  0.82 0.77 0.88  CALCIUM 8.4* 8.4*  --  8.1* 8.3* 8.7*  MG  --   --  2.3  --   --   --    Liver Function Tests: Recent Labs  Lab 02/13/19 1646  AST 23  ALT 43  ALKPHOS 134*  BILITOT 0.6  PROT 7.8  ALBUMIN 2.8*   No results for input(s): LIPASE, AMYLASE in the last 168 hours. No results for input(s): AMMONIA in the last 168 hours. CBC: Recent Labs  Lab 02/13/19 1416 02/15/19 0510 02/16/19 0507  WBC 7.8 9.1 7.3  NEUTROABS 5.8 5.9 4.5  HGB 12.3* 11.7* 12.9*  HCT 37.9* 36.4* 40.0  MCV 96.7 97.1 97.3  PLT 549* 528* 546*   Cardiac Enzymes: No results for input(s): CKTOTAL, CKMB, CKMBINDEX, TROPONINI in the last 168 hours. BNP: Invalid input(s): POCBNP CBG: No results for input(s): GLUCAP in the last 168 hours. D-Dimer Recent Labs    02/13/19 1646  DDIMER 11.04*   Hgb A1c No results for input(s): HGBA1C in the last 72 hours. Lipid Profile Recent Labs    02/13/19 1646  TRIG 71   Thyroid function studies No results for input(s): TSH, T4TOTAL, T3FREE, THYROIDAB in the last 72 hours.  Invalid input(s): FREET3 Anemia work up Recent Labs    02/13/19 1646 02/14/19 0135  VITAMINB12  --  1,115*  FOLATE  --  11.9  FERRITIN 719* 628*  TIBC  --  155*  IRON  --  16*  RETICCTPCT  --  0.9   Urinalysis No results found for: COLORURINE, APPEARANCEUR, LABSPEC, [place], GLUCOSEU, HGBUR, BILIRUBINUR, KETONESUR, PROTEINUR, UROBILINOGEN, NITRITE, LEUKOCYTESUR Sepsis Labs Invalid input(s): PROCALCITONIN,  WBC,  [place] Microbiology Recent Results (from the past 240 hour(s))  SARS Coronavirus 2 [facility] order, Performed in [place] hospital lab)     Status:  None   Collection Time: 02/13/19  4:46 PM  Result Value Ref Range Status   SARS Coronavirus 2 NEGATIVE NEGATIVE Final    Comment: (NOTE) If result is NEGATIVE SARS-CoV-2 target nucleic acids are NOT DETECTED. The SARS-CoV-2 RNA is generally detectable in upper and lower  respiratory specimens during the acute phase of infection.  The lowest  concentration of SARS-CoV-2 viral copies this assay can detect is 250  copies / mL. A negative result does not preclude SARS-CoV-2 infection  and should not be used as the sole basis for treatment or other  patient management decisions.  A negative result may occur with  improper specimen collection / handling, submission of specimen other  than nasopharyngeal swab, presence of viral mutation(s) within the  areas targeted by this assay, and inadequate number of viral copies  (<250 copies / mL). A negative result must be combined with clinical  observations, patient history, and epidemiological information. If result is POSITIVE SARS-CoV-2 target nucleic acids are DETECTED. The SARS-CoV-2 RNA is generally detectable in upper and lower  respiratory specimens dur ing the acute phase of infection.  Positive  results are indicative of active infection with SARS-CoV-2.  Clinical  correlation with patient history and other diagnostic information is  necessary to determine patient infection status.  Positive results do  not rule out bacterial infection or co-infection with other viruses. If result is PRESUMPTIVE POSTIVE SARS-CoV-2 nucleic acids MAY BE PRESENT.   A presumptive positive result was obtained on the submitted specimen  and confirmed on repeat testing.  While 2019 novel coronavirus  (SARS-CoV-2) nucleic acids may be present in the submitted sample  additional confirmatory testing may be necessary for epidemiological  and / or clinical management purposes  to differentiate between  SARS-CoV-2 and other Sarbecovirus currently known to infect  humans.  If clinically indicated additional testing with an alternate test  methodology [phone]) is advised. The SARS-CoV-2 RNA is generally  detectable in upper and lower respiratory sp ecimens during the acute  phase of infection. The expected result is Negative. Fact Sheet for Patients:  StrictlyIdeas.no Fact Sheet for Healthcare Providers: BankingDealers.co.za This test is not yet approved or cleared by the [place] FDA and has been authorized for detection and/or diagnosis of SARS-CoV-2 by FDA under an Emergency Use Authorization (EUA).  This EUA will remain in effect (meaning this test can be used) for the duration of the COVID-19 declaration under Section 564(b)(1) of the Act, 21 U.S.C. section 360bbb-3(b)(1), unless the authorization is terminated or revoked sooner. Performed at [facility], [place] [address], [place], [place] [address]   Blood Culture (routine x 2)     Status: None (Preliminary result)   Collection Time: 02/13/19  4:46 PM  Result Value Ref Range Status   Specimen Description   Final    BLOOD LEFT ANTECUBITAL Performed at [place] [address] [place], [place] [address]    Special Requests   Final    BOTTLES DRAWN AEROBIC AND ANAEROBIC Blood Culture adequate volume Performed at [place] [address] [place], [place] [address]    Culture   Final    NO GROWTH 3 DAYS Performed at [facility], [place] [address], [place], [place] [address]    Report Status PENDING  Incomplete  Blood Culture (routine x 2)     Status: None (Preliminary result)   Collection Time: 02/13/19  4:46 PM  Result Value Ref Range Status   Specimen Description   Final    BLOOD RIGHT FOREARM Performed at [facility], [place] [address], [place], [place] [address]    Special Requests   Final    BOTTLES DRAWN AEROBIC AND ANAEROBIC Blood Culture adequate  volume Performed at [place] [address], [place], [place] [address]  Culture   Final    NO GROWTH 3 DAYS Performed at [facility], [place] [address] [place], [place] [address]    Report Status PENDING  Incomplete  Gram stain     Status: None   Collection Time: 02/14/19 12:31 PM  Result Value Ref Range Status   Specimen Description PLEURAL RIGHT  Final   Special Requests NONE  Final   Gram Stain   Final    RARE WBC PRESENT, PREDOMINANTLY MONONUCLEAR NO ORGANISMS SEEN Performed at [facility], [address], [place], [place] [address]    Report Status 02/15/2019 FINAL  Final  Culture, body fluid-bottle     Status: None (Preliminary result)   Collection Time: 02/14/19 12:31 PM  Result Value Ref Range Status   Specimen Description PLEURAL RIGHT  Final   Special Requests NONE  Final   Culture   Final    NO GROWTH 2 DAYS Performed at [place] [address] [place], [place] [address]    Report Status PENDING  Incomplete     Time coordinating discharge: 45 minutes  SIGNED:   Tawni Millers, MD  [facility] 02/16/2019, 11:14 AM

## 2019-02-16 NOTE — Progress Notes (Signed)
Initial Nutrition Assessment  RD working remotely.   DOCUMENTATION CODES:   Not applicable  INTERVENTION:  - will order boost breeze once/day, each supplement provides 250 kcal and 9 grams of protein. - will order ensure enlive once/day, each supplement provides 350 kcal and 20 grams of protein. - will order daily multivitamin with minerals. - continue to encourage PO intakes.     NUTRITION DIAGNOSIS:   Increased nutrient needs related to acute illness as evidenced by estimated needs.  GOAL:   Patient will meet greater than or equal to 90% of their needs  MONITOR:   PO intake, Supplement acceptance, Labs, Weight trends  REASON FOR ASSESSMENT:   Consult Assessment of nutrition requirement/status  ASSESSMENT:   38 year old male who presented with a pleural effusion. He has no significant past medical history. Reported 3 weeks of worsening dyspnea associated with fevers, chills, pleuritic chest pain, and cough. He was diagnosed as an outpatient with R pleural effusion, sent to the hospital for further evaluation. CT chest showed a large R pleural effusion with compression atelectasis of the R lower and middle lobes, no pulmonary embolism. Patient was admitted to the hospital with the working diagnosis of right pleural effusion/ likely para-pneumonic.    BMI indicates normal weight. Per review of RN flow sheet, patient consumed 100% of dinner on 4/25. Per chart review, current weight is 144 lb and no previous weight hx available in the chart.   Per Dr. Nolon Lennert note yesterday AM: RLL CAP with large R para-pneumonic effusion, hypokalemia with repletion ordered, iron deficiency anemia, and hypoalbuminemia (reason for RD consult).   Albumin has a half-life of 21 days and is strongly affected by stress response and inflammatory process, therefore, do not expect to see an improvement in this lab value during acute hospitalization.  Will order ONS as outlined above to increase kcal  and protein provision and will continue to adjust based on patient preferences and needs.    Medications reviewed; 20 mEq K-Dur x1 dose 4/26. Labs reviewed; Ca: 8.7 mg/dl.     NUTRITION - FOCUSED PHYSICAL EXAM:  unable to complete at this time.  Diet Order:   Diet Order            Diet regular Room service appropriate? Yes; Fluid consistency: Thin  Diet effective now              EDUCATION NEEDS:   No education needs have been identified at this time  Skin:  Skin Assessment: Reviewed RN Assessment  Last BM:  4/26  Height:   Ht Readings from Last 1 Encounters:  02/13/19 5\' 8"  (1.727 m)    Weight:   Wt Readings from Last 1 Encounters:  02/13/19 65.1 kg    Ideal Body Weight:  70 kg  BMI:  Body mass index is 21.83 kg/m.  Estimated Nutritional Needs:   Kcal:  1950-2280 kcal  Protein:  95-105 grams  Fluid:  >/= 2 L/day     Jarome Matin, MS, RD, LDN, [facility] Inpatient Clinical Dietitian Pager # [phone] After hours/weekend pager # [phone]

## 2019-02-18 LAB — CULTURE, BLOOD (ROUTINE X 2)
Culture: NO GROWTH
Culture: NO GROWTH
Special Requests: ADEQUATE
Special Requests: ADEQUATE

## 2019-02-19 LAB — CULTURE, BODY FLUID W GRAM STAIN -BOTTLE: Culture: NO GROWTH

## 2019-03-05 ENCOUNTER — Other Ambulatory Visit: Payer: Self-pay

## 2019-03-05 ENCOUNTER — Ambulatory Visit
Admission: RE | Admit: 2019-03-05 | Discharge: 2019-03-05 | Disposition: A | Discharge status: Home / Self Care | Payer: PRIVATE HEALTH INSURANCE | Source: Ambulatory Visit | Attending: Family Medicine | Admitting: Family Medicine

## 2019-03-05 ENCOUNTER — Encounter: Payer: Self-pay | Admitting: Family Medicine

## 2019-03-05 ENCOUNTER — Ambulatory Visit (INDEPENDENT_AMBULATORY_CARE_PROVIDER_SITE_OTHER): Payer: PRIVATE HEALTH INSURANCE | Admitting: Family Medicine

## 2019-03-05 VITALS — BP 103/68 | HR 86 | Temp 97.8°F | Resp 16 | Ht 68.0 in | Wt 139.0 lb

## 2019-03-05 DIAGNOSIS — Z789 Other specified health status: Secondary | ICD-10-CM | POA: Diagnosis not present

## 2019-03-05 DIAGNOSIS — D649 Anemia, unspecified: Secondary | ICD-10-CM | POA: Diagnosis not present

## 2019-03-05 DIAGNOSIS — Z23 Encounter for immunization: Secondary | ICD-10-CM | POA: Diagnosis not present

## 2019-03-05 DIAGNOSIS — J181 Lobar pneumonia, unspecified organism: Secondary | ICD-10-CM

## 2019-03-05 DIAGNOSIS — J90 Pleural effusion, not elsewhere classified: Secondary | ICD-10-CM

## 2019-03-05 DIAGNOSIS — J189 Pneumonia, unspecified organism: Secondary | ICD-10-CM

## 2019-03-05 MED ORDER — AZITHROMYCIN 250 MG PO TABS: ORAL_TABLET | ORAL | 0 refills | Status: DC

## 2019-03-05 NOTE — Patient Instructions (Addendum)
I have sent an order for a chest xray to [facility]. This is on [address] Please go there for your chest xray.   I sent another antibiotic course called Azithromycin to the pharmacy for you today. I am not convinced that your pneumonia is fully resolved. Continue with cough medications as needed.   Community-Acquired Pneumonia, Adult Pneumonia is an infection of the lungs. It causes swelling in the airways of the lungs. Mucus and fluid may also build up inside the airways. One type of pneumonia can happen while a person is in a hospital. A different type can happen when a person is not in a hospital (community-acquired pneumonia).  What are the causes?  This condition is caused by germs (viruses, bacteria, or fungi). Some types of germs can be passed from one person to another. This can happen when you breathe in droplets from the cough or sneeze of an infected person. What increases the risk? You are more likely to develop this condition if you:  Have a long-term (chronic) disease, such as: ? Chronic obstructive pulmonary disease (COPD). ? Asthma. ? Cystic fibrosis. ? Congestive heart failure. ? Diabetes. ? Kidney disease.  Have HIV.  Have sickle cell disease.  Have had your spleen removed.  Do not take good care of your teeth and mouth (poor dental hygiene).  Have a medical condition that increases the risk of breathing in droplets from your own mouth and nose.  Have a weakened body defense system (immune system).  Are a smoker.  Travel to areas where the germs that cause this illness are common.  Are around certain animals or the places they live. What are the signs or symptoms?  A dry cough.  A wet (productive) cough.  Fever.  Sweating.  Chest pain. This often happens when breathing deeply or coughing.  Fast breathing or trouble breathing.  Shortness of breath.  Shaking chills.  Feeling tired (fatigue).  Muscle aches. How is this  treated? Treatment for this condition depends on many things. Most adults can be treated at home. In some cases, treatment must happen in a hospital. Treatment may include:  Medicines given by mouth or through an IV tube.  Being given extra oxygen.  Respiratory therapy. In rare cases, treatment for very bad pneumonia may include:  Using a machine to help you breathe.  Having a procedure to remove fluid from around your lungs. Follow these instructions at home: Medicines  Take over-the-counter and prescription medicines only as told by your doctor. ? Only take cough medicine if you are losing sleep.  If you were prescribed an antibiotic medicine, take it as told by your doctor. Do not stop taking the antibiotic even if you start to feel better. General instructions   Sleep with your head and neck raised (elevated). You can do this by sleeping in a recliner or by putting a few pillows under your head.  Rest as needed. Get at least 8 hours of sleep each night.  Drink enough water to keep your pee (urine) pale yellow.  Eat a healthy diet that includes plenty of vegetables, fruits, whole grains, low-fat dairy products, and lean protein.  Do not use any products that contain nicotine or tobacco. These include cigarettes, e-cigarettes, and chewing tobacco. If you need help quitting, ask your doctor.  Keep all follow-up visits as told by your doctor. This is important. How is this prevented? A shot (vaccine) can help prevent pneumonia. Shots are often suggested for:  People older than 37 years of age.  People older than 37 years of age who: ? Are having cancer treatment. ? Have long-term (chronic) lung disease. ? Have problems with their body's defense system. You may also prevent pneumonia if you take these actions:  Get the flu (influenza) shot every year.  Go to the dentist as often as told.  Wash your hands often. If you cannot use soap and water, use hand sanitizer.  Contact a doctor if:  You have a fever.  You lose sleep because your cough medicine does not help. Get help right away if:  You are short of breath and it gets worse.  You have more chest pain.  Your sickness gets worse. This is very serious if: ? You are an older adult. ? Your body's defense system is weak.  You cough up blood. Summary  Pneumonia is an infection of the lungs.  Most adults can be treated at home. Some will need treatment in a hospital.  Drink enough water to keep your pee pale yellow.  Get at least 8 hours of sleep each night. This information is not intended to replace advice given to you by your health care provider. Make sure you discuss any questions you have with your health care provider. Document Released: 03/26/2008 Document Revised: 06/05/2018 Document Reviewed: 06/05/2018 Elsevier Interactive Patient Education  2019 [employer].

## 2019-03-05 NOTE — Progress Notes (Addendum)
Patient [facility]  New Patient Encounter Provider: Lanae Boast, [facility]    LFY:[id number]  ENI:[id number]  DOB - [birth year]  SUBJECTIVE:   Aaron Greer, is a 38 y.o. male who presents to establish care with this clinic.   Current problems/concerns: Patient admitted to hospital due to pleural effusion. Found to have Right lower lobe community acquired pnemonia. Admitted from 4/24/-02/16/2019. Patient reports completing his entire course of augmentin. He reports continuing to have SOB and cough. He states that the SOB occurs 2 or more times a day and is worsened with movement. He states that he feels hot at night, but has not taken his temperature.   No Known Allergies History reviewed. No pertinent past medical history. Current Outpatient Medications on File Prior to Visit  Medication Sig Dispense Refill  . acetaminophen (TYLENOL) 500 MG tablet Take 1 tablet (500 mg total) by mouth every 6 (six) hours as needed for moderate pain (or Fever >/= 101). 20 tablet 0  . ferrous gluconate (FERGON) 324 MG tablet Take 1 tablet (324 mg total) by mouth daily with breakfast for 30 days. 30 tablet 0  . guaiFENesin-dextromethorphan (ROBITUSSIN DM) 100-10 MG/5ML syrup Take 5 mLs by mouth every 6 (six) hours as needed for cough. 118 mL 0   No current facility-administered medications on file prior to visit.    History reviewed. No pertinent family history. Social History   Socioeconomic History  . Marital status: Married    Spouse name: Not on file  . Number of children: Not on file  . Years of education: Not on file  . Highest education level: Not on file  Occupational History  . Not on file  Social Needs  . Financial resource strain: Not on file  . Food insecurity:    Worry: Not on file    Inability: Not on file  . Transportation needs:    Medical: Not on file    Non-medical: Not on file  Tobacco Use  . Smoking status: Former Smoker    Years:  10.00  . Smokeless tobacco: Never Used  . Tobacco comment: Quit in 2010  Substance and Sexual Activity  . Alcohol use: Yes    Comment: Social  . Drug use: Never  . Sexual activity: Yes  Lifestyle  . Physical activity:    Days per week: Not on file    Minutes per session: Not on file  . Stress: Not on file  Relationships  . Social connections:    Talks on phone: Not on file    Gets together: Not on file    Attends religious service: Not on file    Active member of club or organization: Not on file    Attends meetings of clubs or organizations: Not on file    Relationship status: Not on file  . Intimate partner violence:    Fear of current or ex partner: Not on file    Emotionally abused: Not on file    Physically abused: Not on file    Forced sexual activity: Not on file  Other Topics Concern  . Not on file  Social History Narrative  . Not on file    Review of Systems  Constitutional: Positive for malaise/fatigue.  HENT: Negative.   Eyes: Negative.   Respiratory: Positive for cough and shortness of breath.   Cardiovascular: Negative.   Gastrointestinal: Negative.   Genitourinary: Negative.   Musculoskeletal: Negative.   Skin: Negative.   Neurological: Negative.  Psychiatric/Behavioral: Negative.      OBJECTIVE:    BP 103/68 (BP Location: Right Arm, Patient Position: Sitting, Cuff Size: Normal)   Pulse 86   Temp 97.8 F (36.6 C) (Oral)   Resp 16   Ht 5\' 8"  (1.727 m)   Wt 139 lb (63 kg)   SpO2 100%   BMI 21.13 kg/m   Physical Exam  Constitutional: He is oriented to person, place, and time and well-developed, well-nourished, and in no distress. No distress.  HENT:  Head: Normocephalic and atraumatic.  Eyes: Pupils are equal, round, and reactive to light. Conjunctivae and EOM are normal.  Neck: Normal range of motion. Neck supple.  Cardiovascular: Normal rate, regular rhythm and intact distal pulses. Exam reveals no gallop and no friction rub.  No murmur  heard. Pulmonary/Chest: Effort normal. No respiratory distress. He has decreased breath sounds in the right middle field and the right lower field. He has no wheezes.  Abdominal: Soft. Bowel sounds are normal. There is no abdominal tenderness.  Musculoskeletal: Normal range of motion.        General: No tenderness or edema.  Lymphadenopathy:    He has no cervical adenopathy.  Neurological: He is alert and oriented to person, place, and time. Gait normal.  Skin: Skin is warm and dry.  Psychiatric: Mood, memory, affect and judgment normal.  Nursing note and vitals reviewed.    ASSESSMENT/PLAN:  1. Normocytic anemia - Iron, TIBC and Ferritin Panel - CBC with Differential - Comprehensive metabolic panel  2. Pneumonia of right lower lobe due to infectious organism [facility]) - DG Chest 2 View; Future- Chest xray ordered today. RLL with decreased breath sounds on auscultation. Will start on antibiotic course.  - azithromycin (ZITHROMAX) 250 MG tablet; Take as directed on pack  Dispense: 6 tablet; Refill: 0  3. Need for Tdap vaccination - Tdap vaccine greater than or equal to 7yo IM   4. Language Barrier to [employer] utilized throughout the encounter.   Return in about 2 weeks (around 03/19/2019) for CAP.  The patient was given clear instructions to go to ER or return to medical center if symptoms don't improve, worsen or new problems develop. The patient verbalized understanding. The patient was told to call to get lab results if they haven't heard anything in the next week.     This note has been created with Surveyor, quantity. Any transcriptional errors are unintentional.   Ms. Andr L. Nathaneil Canary, FNP-BC [facility] [address] [place], [place] [address] [phone]

## 2019-03-06 LAB — IRON,TIBC AND FERRITIN PANEL
Ferritin: 609 ng/mL — ABNORMAL HIGH (ref 30–400)
Iron Saturation: 23 % (ref 15–55)
Iron: 50 ug/dL (ref 38–169)
Total Iron Binding Capacity: 213 ug/dL — ABNORMAL LOW (ref 250–450)
UIBC: 163 ug/dL (ref 111–343)

## 2019-03-06 LAB — COMPREHENSIVE METABOLIC PANEL
ALT: 22 IU/L (ref 0–44)
AST: 20 IU/L (ref 0–40)
Albumin/Globulin Ratio: 1 — ABNORMAL LOW (ref 1.2–2.2)
Albumin: 3.9 g/dL — ABNORMAL LOW (ref 4.0–5.0)
Alkaline Phosphatase: 76 IU/L (ref 39–117)
BUN/Creatinine Ratio: 15 (ref 9–20)
BUN: 12 mg/dL (ref 6–20)
Bilirubin Total: 0.5 mg/dL (ref 0.0–1.2)
CO2: 24 mmol/L (ref 20–29)
Calcium: 9.6 mg/dL (ref 8.7–10.2)
Chloride: 98 mmol/L (ref 96–106)
Creatinine, Ser: 0.8 mg/dL (ref 0.76–1.27)
GFR calc Af Amer: 133 mL/min/{1.73_m2} (ref >59)
GFR calc non Af Amer: 115 mL/min/{1.73_m2} (ref >59)
Globulin, Total: 4 g/dL (ref 1.5–4.5)
Glucose: 87 mg/dL (ref 65–99)
Potassium: 4.1 mmol/L (ref 3.5–5.2)
Sodium: 138 mmol/L (ref 134–144)
Total Protein: 7.9 g/dL (ref 6.0–8.5)

## 2019-03-06 LAB — CBC WITH DIFFERENTIAL/PLATELET
Basophils Absolute: 0.1 10*3/uL (ref 0.0–0.2)
Basos: 2 %
EOS (ABSOLUTE): 0.3 10*3/uL (ref 0.0–0.4)
Eos: 5 %
Hematocrit: 37.8 % (ref 37.5–51.0)
Hemoglobin: 12.7 g/dL — ABNORMAL LOW (ref 13.0–17.7)
Immature Grans (Abs): 0 10*3/uL (ref 0.0–0.1)
Immature Granulocytes: 1 %
Lymphocytes Absolute: 1.4 10*3/uL (ref 0.7–3.1)
Lymphs: 24 %
MCH: 30.8 pg (ref 26.6–33.0)
MCHC: 33.6 g/dL (ref 31.5–35.7)
MCV: 92 fL (ref 79–97)
Monocytes Absolute: 0.7 10*3/uL (ref 0.1–0.9)
Monocytes: 12 %
Neutrophils Absolute: 3.4 10*3/uL (ref 1.4–7.0)
Neutrophils: 56 %
Platelets: 362 10*3/uL (ref 150–450)
RBC: 4.13 x10E6/uL — ABNORMAL LOW (ref 4.14–5.80)
RDW: 11.8 % (ref 11.6–15.4)
WBC: 6 10*3/uL (ref 3.4–10.8)

## 2019-03-10 ENCOUNTER — Ambulatory Visit (HOSPITAL_COMMUNITY)
Admission: RE | Admit: 2019-03-10 | Discharge: 2019-03-10 | Disposition: A | Discharge status: Home / Self Care | Payer: PRIVATE HEALTH INSURANCE | Source: Ambulatory Visit | Attending: Family Medicine | Admitting: Family Medicine

## 2019-03-10 ENCOUNTER — Telehealth: Payer: Self-pay

## 2019-03-10 ENCOUNTER — Other Ambulatory Visit: Payer: Self-pay

## 2019-03-10 DIAGNOSIS — J189 Pneumonia, unspecified organism: Secondary | ICD-10-CM

## 2019-03-10 DIAGNOSIS — J181 Lobar pneumonia, unspecified organism: Secondary | ICD-10-CM | POA: Insufficient documentation

## 2019-03-10 NOTE — Telephone Encounter (Signed)
-----   Message from Lanae Boast, [place] sent at 03/10/2019  8:48 AM EDT ----- Please see if this patient is continuing to have cough or has developed SOB. The last xray shows that the fluid has returned on his lungs. I would like to send him to interventional radiology for possible removal of fluid if his symptoms are worsening. He will need to have a follow up Xray by Friday.

## 2019-03-10 NOTE — Telephone Encounter (Signed)
Called and spoke with patient he denies cough but states he still has some shortness of breath. I advised that last xray does show some fluid has returned, advised that he needs to have a follow up xray by Friday and he may need to be sent to have some fluid removed if he is getting worse. Patient states he will have xray done today or tomorrow. Thanks!

## 2019-03-10 NOTE — Addendum Note (Signed)
Addended by: Genelle Bal on: 03/10/2019 08:48 AM   Modules accepted: Orders

## 2019-03-10 NOTE — Addendum Note (Signed)
Addended by: Genelle Bal on: 03/10/2019 08:49 AM   Modules accepted: Orders

## 2019-03-11 ENCOUNTER — Other Ambulatory Visit (HOSPITAL_COMMUNITY): Payer: Self-pay | Admitting: Family Medicine

## 2019-03-11 ENCOUNTER — Other Ambulatory Visit: Payer: Self-pay | Admitting: Family Medicine

## 2019-03-11 DIAGNOSIS — J90 Pleural effusion, not elsewhere classified: Secondary | ICD-10-CM

## 2019-03-11 NOTE — Progress Notes (Signed)
Procedure ordered.

## 2019-03-12 ENCOUNTER — Ambulatory Visit (HOSPITAL_COMMUNITY): Admission: RE | Admit: 2019-03-12 | Payer: PRIVATE HEALTH INSURANCE | Source: Ambulatory Visit

## 2019-03-13 ENCOUNTER — Other Ambulatory Visit (HOSPITAL_COMMUNITY): Payer: PRIVATE HEALTH INSURANCE

## 2019-03-13 ENCOUNTER — Other Ambulatory Visit (HOSPITAL_COMMUNITY)
Admission: RE | Admit: 2019-03-13 | Discharge: 2019-03-13 | Disposition: A | Discharge status: Home / Self Care | Payer: PRIVATE HEALTH INSURANCE | Source: Ambulatory Visit | Attending: Family Medicine | Admitting: Family Medicine

## 2019-03-13 DIAGNOSIS — Z1159 Encounter for screening for other viral diseases: Secondary | ICD-10-CM | POA: Diagnosis present

## 2019-03-14 LAB — NOVEL CORONAVIRUS, NAA (HOSP ORDER, SEND-OUT TO REF LAB; TAT 18-24 HRS): SARS-CoV-2, NAA: NOT DETECTED

## 2019-03-16 LAB — FUNGUS CULTURE RESULT

## 2019-03-16 LAB — FUNGAL ORGANISM REFLEX

## 2019-03-16 LAB — FUNGUS CULTURE WITH STAIN

## 2019-03-18 ENCOUNTER — Ambulatory Visit (HOSPITAL_COMMUNITY)
Admission: RE | Admit: 2019-03-18 | Discharge: 2019-03-18 | Disposition: A | Discharge status: Home / Self Care | Payer: PRIVATE HEALTH INSURANCE | Source: Ambulatory Visit | Attending: Family Medicine | Admitting: Family Medicine

## 2019-03-18 ENCOUNTER — Encounter (HOSPITAL_COMMUNITY): Payer: Self-pay | Admitting: Radiology

## 2019-03-18 ENCOUNTER — Other Ambulatory Visit: Payer: Self-pay | Admitting: Family Medicine

## 2019-03-18 ENCOUNTER — Other Ambulatory Visit: Payer: Self-pay

## 2019-03-18 DIAGNOSIS — J90 Pleural effusion, not elsewhere classified: Secondary | ICD-10-CM | POA: Insufficient documentation

## 2019-03-18 HISTORY — PX: IR THORACENTESIS ASP PLEURAL SPACE W/IMG GUIDE: IMG5380

## 2019-03-18 LAB — GRAM STAIN

## 2019-03-18 MED ORDER — LIDOCAINE HCL 1 % IJ SOLN
INTRAMUSCULAR | Status: AC
  Filled 2019-03-18: qty 20

## 2019-03-18 MED ORDER — LIDOCAINE HCL 1 % IJ SOLN
INTRAMUSCULAR | Status: DC | PRN
  Administered 2019-03-18: 10 mL

## 2019-03-18 NOTE — Procedures (Signed)
   Rt thoracentesis  Small amount of fluid 60 cc collected--blood tinged Sent for labs per MD  CXR no ptx per Dr Anselm Pancoast  EBL: minimal

## 2019-03-19 ENCOUNTER — Encounter: Payer: Self-pay | Admitting: Family Medicine

## 2019-03-19 ENCOUNTER — Ambulatory Visit (INDEPENDENT_AMBULATORY_CARE_PROVIDER_SITE_OTHER): Payer: PRIVATE HEALTH INSURANCE | Admitting: Family Medicine

## 2019-03-19 VITALS — BP 100/67 | HR 98 | Temp 99.0°F | Resp 14 | Ht 68.0 in | Wt 139.0 lb

## 2019-03-19 DIAGNOSIS — J181 Lobar pneumonia, unspecified organism: Secondary | ICD-10-CM | POA: Diagnosis not present

## 2019-03-19 DIAGNOSIS — J90 Pleural effusion, not elsewhere classified: Secondary | ICD-10-CM

## 2019-03-19 DIAGNOSIS — J189 Pneumonia, unspecified organism: Secondary | ICD-10-CM

## 2019-03-19 NOTE — Progress Notes (Signed)
  [facility]   Progress Note: General Provider: Lanae Boast, FNP  SUBJECTIVE:   Aaron Greer is a 38 y.o. male who  has no past medical history on file.. Patient presents today for Follow-up (pneumonia ) Patient reports decreased SOB and cough. Pre-surgical COVID test negative. IR thoracentesis performed on 03/18/2019. 60 cc fluid removed. Patient states that he is breathing better this AM.   Patient reports wanting to return to work. He states that he is feeling 90% better. Small amount of phlegm in the morning that is resolved with warm fluids, lemon and honey.   Review of Systems  Constitutional: Negative.   HENT: Negative.   Eyes: Negative.   Respiratory: Negative.   Cardiovascular: Negative.   Gastrointestinal: Negative.   Genitourinary: Negative.   Musculoskeletal: Negative.   Skin: Negative.   Neurological: Negative.   Psychiatric/Behavioral: Negative.      OBJECTIVE: BP 100/67 (BP Location: Left Arm, Patient Position: Sitting, Cuff Size: Normal)   Pulse 98   Temp 99 F (37.2 C) (Oral)   Resp 14   Ht 5\' 8"  (1.727 m)   Wt 139 lb (63 kg)   SpO2 99%   BMI 21.13 kg/m   Wt Readings from Last 3 Encounters:  03/19/19 139 lb (63 kg)  03/05/19 139 lb (63 kg)  02/13/19 143 lb 9.6 oz (65.1 kg)     Physical Exam Vitals signs and nursing note reviewed.  Constitutional:      General: He is not in acute distress.    Appearance: Normal appearance.  HENT:     Head: Normocephalic and atraumatic.  Eyes:     Extraocular Movements: Extraocular movements intact.     Conjunctiva/sclera: Conjunctivae normal.     Pupils: Pupils are equal, round, and reactive to light.  Cardiovascular:     Rate and Rhythm: Normal rate and regular rhythm.     Heart sounds: No murmur.  Pulmonary:     Effort: Pulmonary effort is normal.     Breath sounds: Normal breath sounds.  Musculoskeletal: Normal range of motion.  Skin:    General: Skin  is warm and dry.  Neurological:     Mental Status: He is alert and oriented to person, place, and time.  Psychiatric:        Mood and Affect: Mood normal.        Behavior: Behavior normal.        Thought Content: Thought content normal.        Judgment: Judgment normal.     ASSESSMENT/PLAN:  1. Pneumonia of right lower lobe due to infectious organism [facility]) Resolved. Patient is improving.  We will continue to monitor and repeat a chest x ray in 4 weeks.   2. Pleural effusion Patient with recurring pleural effusion. Will refer to pulm for further evaluation.  - Ambulatory referral to Pulmonology   Return in about 4 weeks (around 04/16/2019).    The patient was given clear instructions to go to ER or return to medical center if symptoms do not improve, worsen or new problems develop. The patient verbalized understanding and agreed with plan of care.   Aaron Greer. Aaron Canary, FNP-BC [facility] [address] [place], [place] [address] [phone]

## 2019-03-19 NOTE — Patient Instructions (Signed)
Community-Acquired Pneumonia, Adult  Pneumonia is an infection of the lungs. It causes swelling in the airways of the lungs. Mucus and fluid may also build up inside the airways.  One type of pneumonia can happen while a person is in a hospital. A different type can happen when a person is not in a hospital (community-acquired pneumonia).   What are the causes?    This condition is caused by germs (viruses, bacteria, or fungi). Some types of germs can be passed from one person to another. This can happen when you breathe in droplets from the cough or sneeze of an infected person.  What increases the risk?  You are more likely to develop this condition if you:   Have a long-term (chronic) disease, such as:  ? Chronic obstructive pulmonary disease (COPD).  ? Asthma.  ? Cystic fibrosis.  ? Congestive heart failure.  ? Diabetes.  ? Kidney disease.   Have HIV.   Have sickle cell disease.   Have had your spleen removed.   Do not take good care of your teeth and mouth (poor dental hygiene).   Have a medical condition that increases the risk of breathing in droplets from your own mouth and nose.   Have a weakened body defense system (immune system).   Are a smoker.   Travel to areas where the germs that cause this illness are common.   Are around certain animals or the places they live.  What are the signs or symptoms?   A dry cough.   A wet (productive) cough.   Fever.   Sweating.   Chest pain. This often happens when breathing deeply or coughing.   Fast breathing or trouble breathing.   Shortness of breath.   Shaking chills.   Feeling tired (fatigue).   Muscle aches.  How is this treated?  Treatment for this condition depends on many things. Most adults can be treated at home. In some cases, treatment must happen in a hospital. Treatment may include:   Medicines given by mouth or through an IV tube.   Being given extra oxygen.   Respiratory therapy.  In rare cases, treatment for very bad pneumonia  may include:   Using a machine to help you breathe.   Having a procedure to remove fluid from around your lungs.  Follow these instructions at home:  Medicines   Take over-the-counter and prescription medicines only as told by your doctor.  ? Only take cough medicine if you are losing sleep.   If you were prescribed an antibiotic medicine, take it as told by your doctor. Do not stop taking the antibiotic even if you start to feel better.  General instructions     Sleep with your head and neck raised (elevated). You can do this by sleeping in a recliner or by putting a few pillows under your head.   Rest as needed. Get at least 8 hours of sleep each night.   Drink enough water to keep your pee (urine) pale yellow.   Eat a healthy diet that includes plenty of vegetables, fruits, whole grains, low-fat dairy products, and lean protein.   Do not use any products that contain nicotine or tobacco. These include cigarettes, e-cigarettes, and chewing tobacco. If you need help quitting, ask your doctor.   Keep all follow-up visits as told by your doctor. This is important.  How is this prevented?  A shot (vaccine) can help prevent pneumonia. Shots are often suggested for:   People   older than 37 years of age.   People older than 37 years of age who:  ? Are having cancer treatment.  ? Have long-term (chronic) lung disease.  ? Have problems with their body's defense system.  You may also prevent pneumonia if you take these actions:   Get the flu (influenza) shot every year.   Go to the dentist as often as told.   Wash your hands often. If you cannot use soap and water, use hand sanitizer.  Contact a doctor if:   You have a fever.   You lose sleep because your cough medicine does not help.  Get help right away if:   You are short of breath and it gets worse.   You have more chest pain.   Your sickness gets worse. This is very serious if:  ? You are an older adult.  ? Your body's defense system is weak.   You  cough up blood.  Summary   Pneumonia is an infection of the lungs.   Most adults can be treated at home. Some will need treatment in a hospital.   Drink enough water to keep your pee pale yellow.   Get at least 8 hours of sleep each night.  This information is not intended to replace advice given to you by your health care provider. Make sure you discuss any questions you have with your health care provider.  Document Released: 03/26/2008 Document Revised: 06/05/2018 Document Reviewed: 06/05/2018  Elsevier Interactive Patient Education  2019 [employer]

## 2019-03-23 LAB — CULTURE, BODY FLUID W GRAM STAIN -BOTTLE: Culture: NO GROWTH

## 2019-03-31 LAB — ACID FAST CULTURE WITH REFLEXED SENSITIVITIES (MYCOBACTERIA): Acid Fast Culture: NEGATIVE

## 2019-04-15 ENCOUNTER — Telehealth: Payer: Self-pay

## 2019-04-15 NOTE — Telephone Encounter (Signed)
Called and spoke with patient for COVID 19 Screening. Patient had no risk factors and is cleared to come into office for appointment. Thanks! 

## 2019-04-16 ENCOUNTER — Encounter: Payer: Self-pay | Admitting: Family Medicine

## 2019-04-16 ENCOUNTER — Other Ambulatory Visit: Payer: Self-pay

## 2019-04-16 ENCOUNTER — Ambulatory Visit (INDEPENDENT_AMBULATORY_CARE_PROVIDER_SITE_OTHER): Payer: PRIVATE HEALTH INSURANCE | Admitting: Family Medicine

## 2019-04-16 VITALS — BP 104/70 | HR 79 | Temp 98.2°F | Resp 16 | Ht 68.0 in | Wt 138.0 lb

## 2019-04-16 DIAGNOSIS — J90 Pleural effusion, not elsewhere classified: Secondary | ICD-10-CM | POA: Diagnosis not present

## 2019-04-16 DIAGNOSIS — J189 Pneumonia, unspecified organism: Secondary | ICD-10-CM

## 2019-04-16 DIAGNOSIS — J181 Lobar pneumonia, unspecified organism: Secondary | ICD-10-CM

## 2019-04-16 NOTE — Patient Instructions (Addendum)
HAPPY BIRTHDAY!!!!! Please call [facility]: [phone] to make an appointment for further evaluation of your lungs.   Pleural Effusion Pleural effusion is an abnormal buildup of fluid in the layers of tissue between the lungs and the inside of the chest (pleural space) The two layers of tissue that line the lungs and the inside of the chest are called pleura. Usually, there is no air in the space between the pleura, only a thin layer of fluid. Some conditions can cause a large amount of fluid to build up, which can cause the lung to collapse if untreated. A pleural effusion is usually caused by another disease that requires treatment. What are the causes? Pleural effusion can be caused by:  Heart failure.  Certain infections, such as pneumonia or tuberculosis.  Cancer.  A blood clot in the lung (pulmonary embolism).  Complications from surgery, such as from open heart surgery.  Liver disease (cirrhosis).  Kidney disease. What are the signs or symptoms? In some cases, pleural effusion may cause no symptoms. If symptoms are present, they may include:  Shortness of breath, especially when lying down.  Chest pain. This may get worse when taking a deep breath.  Fever.  Dry, long-lasting (chronic) cough.  Hiccups.  Rapid breathing. An underlying condition that is causing the pleural effusion (such as heart failure, pneumonia, blood clots, tuberculosis, or cancer) may also cause other symptoms. How is this diagnosed? This condition may be diagnosed based on:  Your symptoms and medical history.  A physical exam.  A chest X-ray.  A procedure to use a needle to remove fluid from the pleural space (thoracentesis). This fluid is tested.  Other imaging studies of the chest, such as ultrasound or CT scan. How is this treated? Depending on the cause of your condition, treatment may include:  Treating the underlying condition that is causing the effusion. When that  condition improves, the effusion will also improve. Examples of treatment for underlying conditions include: ? Antibiotic medicines to treat an infection. ? Diuretics or other heart medicines to treat heart failure.  Thoracentesis.  Placing a thin flexible tube under your skin and into your chest to continuously drain the effusion (indwelling pleural catheter).  Surgery to remove the outer layer of tissue from the pleural space (decortication).  A procedure to put medicine into the chest cavity to seal the pleural space and prevent fluid buildup (pleurodesis).  Chemotherapy and radiation therapy, if you have cancerous (malignant) pleural effusion. These treatments are typically used to treat cancer. They kill certain cells in the body. Follow these instructions at home:  Take over-the-counter and prescription medicines only as told by your health care provider.  Ask your health care provider what activities are safe for you.  Keep track of how long you are able to do mild exercise (such as walking) before you get short of breath. Write down this information to share with your health care provider. Your ability to exercise should improve over time.  Do not use any products that contain nicotine or tobacco, such as cigarettes and e-cigarettes. If you need help quitting, ask your health care provider.  Keep all follow-up visits as told by your health care provider. This is important. Contact a health care provider if:  The amount of time that you are able to do mild exercise: ? Decreases. ? Does not improve with time.  You have a fever. Get help right away if:  You are short of breath.  You develop chest pain.  You develop a new cough. Summary  Pleural effusion is an abnormal buildup of fluid in the layers of tissue between the lungs and the inside of the chest.  Pleural effusion can have many causes, including heart failure, pulmonary embolism, infections, or cancer.   Symptoms of pleural effusion can include shortness of breath, chest pain, fever, long-lasting (chronic) cough, hiccups, or rapid breathing.  Diagnosis often involves making images of the chest (such as with ultrasound or X-ray) and removing fluid (thoracentesis) to send for testing.  Treatment for pleural effusion depends on what underlying condition is causing it. This information is not intended to replace advice given to you by your health care provider. Make sure you discuss any questions you have with your health care provider. Document Released: 10/08/2005 Document Revised: 06/13/2017 Document Reviewed: 06/13/2017 Elsevier Interactive Patient Education  2019 [employer].

## 2019-04-16 NOTE — Progress Notes (Signed)
  Patient [facility]   Progress Note: General Provider: Lanae Boast, FNP  SUBJECTIVE:   Aaron Greer is a 38 y.o. male who  has no past medical history on file.. Patient presents today for Follow-up (pneumonia ) Patient states that he has intermittent pain in the right side of chest and back for the past 3 weeks. IR thoracentesis performed on 03/18/2019. 60 cc fluid removed. He denies cough, SOB, fever, chills or night sweats.   Review of Systems  Constitutional: Negative.   HENT: Negative.   Eyes: Negative.   Respiratory: Negative.   Cardiovascular: Negative.   Gastrointestinal: Negative.   Genitourinary: Negative.   Musculoskeletal: Negative.   Skin: Negative.   Neurological: Negative.   Psychiatric/Behavioral: Negative.      OBJECTIVE: BP 104/70 (BP Location: Left Arm, Patient Position: Sitting, Cuff Size: Normal)   Pulse 79   Temp 98.2 F (36.8 C) (Oral)   Resp 16   Ht 5\' 8"  (1.727 m)   Wt 138 lb (62.6 kg)   SpO2 100%   BMI 20.98 kg/m   Wt Readings from Last 3 Encounters:  04/16/19 138 lb (62.6 kg)  03/19/19 139 lb (63 kg)  03/05/19 139 lb (63 kg)     Physical Exam Vitals signs and nursing note reviewed.  Constitutional:      General: He is not in acute distress.    Appearance: Normal appearance.  HENT:     Head: Normocephalic and atraumatic.  Eyes:     Extraocular Movements: Extraocular movements intact.     Conjunctiva/sclera: Conjunctivae normal.     Pupils: Pupils are equal, round, and reactive to light.  Cardiovascular:     Rate and Rhythm: Normal rate and regular rhythm.     Heart sounds: No murmur.  Pulmonary:     Effort: Pulmonary effort is normal.     Breath sounds: Examination of the right-lower field reveals decreased breath sounds. Decreased breath sounds present. No wheezing.  Musculoskeletal: Normal range of motion.  Skin:    General: Skin is warm and dry.  Neurological:     Mental Status: He  is alert and oriented to person, place, and time.  Psychiatric:        Mood and Affect: Mood normal.        Behavior: Behavior normal.        Thought Content: Thought content normal.        Judgment: Judgment normal.     ASSESSMENT/PLAN:  1. Pleural effusion on right - DG Chest 2 View; Future  2. Pneumonia of right lower lobe due to infectious organism [facility]) - DG Chest 2 View; Future    Return in about 3 months (around 07/17/2019), or if symptoms worsen or fail to improve, for pleural effusion.    The patient was given clear instructions to go to ER or return to medical center if symptoms do not improve, worsen or new problems develop. The patient verbalized understanding and agreed with plan of care.   Ms. Doug Sou. Nathaneil Canary, FNP-BC [facility] [address] [place], [place] [address] [phone]

## 2019-04-20 ENCOUNTER — Other Ambulatory Visit: Payer: Self-pay

## 2019-04-20 ENCOUNTER — Ambulatory Visit (INDEPENDENT_AMBULATORY_CARE_PROVIDER_SITE_OTHER): Payer: PRIVATE HEALTH INSURANCE | Admitting: Emergency Medicine

## 2019-04-20 ENCOUNTER — Encounter: Payer: Self-pay | Admitting: Emergency Medicine

## 2019-04-20 ENCOUNTER — Ambulatory Visit (INDEPENDENT_AMBULATORY_CARE_PROVIDER_SITE_OTHER): Payer: PRIVATE HEALTH INSURANCE

## 2019-04-20 VITALS — BP 128/68 | HR 65 | Ht 67.0 in | Wt 137.0 lb

## 2019-04-20 DIAGNOSIS — R590 Localized enlarged lymph nodes: Secondary | ICD-10-CM | POA: Insufficient documentation

## 2019-04-20 DIAGNOSIS — J90 Pleural effusion, not elsewhere classified: Secondary | ICD-10-CM | POA: Diagnosis not present

## 2019-04-20 NOTE — Assessment & Plan Note (Signed)
Exudative right pleural effusion that was discovered in the setting of fever, cough, headache, weight loss.  Etiology is not entirely clear.  It was ascribed to community-acquired pneumonia but his procalcitonin was negative, no significant infiltrate noted on CT scan of his chest beyond compressive atelectatic change.  The effusion was lymphocyte predominant, exudative, culture negative.  Certainly this could have been a parapneumonic effusion but I think we have to evaluate for other possible causes.  He is a native [place] and I believe he needs a QuantiFERON gold.  We will perform autoimmune labs to screen for possible noninfectious causes.  He is at risk for lymphoma, had a subcarinal node on a CT scan of the chest.  We will repeat to better characterize the node.  He may merit biopsy or if he has recurrence of his pleural effusion then we may be able to pull perform flow cytometry on the cytology on repeat thoracentesis.  We will perform lab work today We will repeat your chest x-ray today to evaluate your pleural fluid We will repeat your CT scan of the chest Follow with Dr. Lamonte Sakai next available after your testing is completed to review the results together

## 2019-04-20 NOTE — Assessment & Plan Note (Signed)
Cause unclear, could have been reactive given the recent illness.  Needs a repeat CT chest to characterize.  We could consider biopsy with endobronchial ultrasound depending on the results.

## 2019-04-20 NOTE — Progress Notes (Signed)
Subjective:    Patient ID: Aaron Greer, male    DOB: 1981/08/09, 38 y.o.   MRN: [id number]  HPI 38 year old former smoker (1 pack year), native of with little past medical history.  He was admitted April 24-27 for a large right pleural effusion and suspected associated CAP. He had fever and cough, HA that started about 4-5 days prior to that admission cough was non-productive. No fevers prior.  He was treated with antibiotics with some clinical improvement.  Interestingly his pro calcitonin was less than 0.1.  Thoracentesis was performed that showed a lymphocyte predominant exudate, culture negative.  Glucose 63, pH 7.5.  HIV negative, pneumococcal and Legionella urinary antigens both negative. Denies any trauma, chest injury. He lost wt during the febrile illness, has since gained it back.   A repeat thoracentesis was done on 5/27, again culture negative. He was having pain and dyspnea. Postprocedural chest x-ray 5/27 showed almost full resolution of his right pleural effusion. Symptoms improved since. He can still feel occasional pleuritic pain, often at his thora site. No more fevers.    Review of Systems  Constitutional: Negative for fever and unexpected weight change.  HENT: Negative for congestion, dental problem, ear pain, nosebleeds, postnasal drip, rhinorrhea, sinus pressure, sneezing, sore throat and trouble swallowing.   Eyes: Negative for redness and itching.  Respiratory: Negative for cough, chest tightness, shortness of breath and wheezing.   Cardiovascular: Negative for palpitations and leg swelling.  Gastrointestinal: Negative for nausea and vomiting.  Genitourinary: Negative for dysuria.  Musculoskeletal: Negative for joint swelling.  Skin: Negative for rash.  Neurological: Negative for headaches.  Hematological: Does not bruise/bleed easily.  Psychiatric/Behavioral: Negative for dysphoric mood. The patient is not nervous/anxious.     No past medical history on file.    No family history on file.  No hx lung disease, no hx CA or lymphoma in family.    Social History   Socioeconomic History  . Marital status: Married    Spouse name: Not on file  . Number of children: Not on file  . Years of education: Not on file  . Highest education level: Not on file  Occupational History  . Not on file  Social Needs  . Financial resource strain: Not on file  . Food insecurity    Worry: Not on file    Inability: Not on file  . Transportation needs    Medical: Not on file    Non-medical: Not on file  Tobacco Use  . Smoking status: Former Smoker    Years: 10.00  . Smokeless tobacco: Never Used  . Tobacco comment: Quit in 2010  Substance and Sexual Activity  . Alcohol use: Yes    Comment: Social  . Drug use: Never  . Sexual activity: Yes  Lifestyle  . Physical activity    Days per week: Not on file    Minutes per session: Not on file  . Stress: Not on file  Relationships  . Social Herbalist on phone: Not on file    Gets together: Not on file    Attends religious service: Not on file    Active member of club or organization: Not on file    Attends meetings of clubs or organizations: Not on file    Relationship status: Not on file  . Intimate partner violence    Fear of current or ex partner: Not on file    Emotionally abused: Not on file  Physically abused: Not on file    Forced sexual activity: Not on file  Other Topics Concern  . Not on file  Social History Narrative  . Not on file  Originally from [place], moved to [place] 2 yrs ago.  Exposed to dust through [employer] work, wears Biomedical scientist.  Formerly a Architect.  No known TB exposure.   No Known Allergies   Outpatient Medications Prior to Visit  Medication Sig Dispense Refill  . acetaminophen (TYLENOL) 500 MG tablet Take 1 tablet (500 mg total) by mouth every 6 (six) hours as needed for moderate pain (or Fever >/= 101). 20 tablet 0  . ferrous gluconate (FERGON)  324 MG tablet Take 1 tablet (324 mg total) by mouth daily with breakfast for 30 days. 30 tablet 0  . guaiFENesin-dextromethorphan (ROBITUSSIN DM) 100-10 MG/5ML syrup Take 5 mLs by mouth every 6 (six) hours as needed for cough. (Patient not taking: Reported on 03/19/2019) 118 mL 0   No facility-administered medications prior to visit.         Objective:   Physical Exam Vitals:   04/20/19 0907  BP: 128/68  Pulse: 65  SpO2: 98%  Weight: 137 lb (62.1 kg)  Height: 5\' 7"  (1.702 m)   Gen: Pleasant, thin man, in no distress,  normal affect  ENT: No lesions,  mouth clear,  oropharynx clear, no postnasal drip  Neck: No JVD, no stridor  Lungs: No use of accessory muscles, decreased BS on the R, no crackles or wheezes.   Cardiovascular: RRR, heart sounds normal, no murmur or gallops, no peripheral edema  Musculoskeletal: No deformities, no cyanosis or clubbing  Neuro: alert, awake, non focal  Skin: Warm, no lesions or rash      Assessment & Plan:  Pleural effusion on right Exudative right pleural effusion that was discovered in the setting of fever, cough, headache, weight loss.  Etiology is not entirely clear.  It was ascribed to community-acquired pneumonia but his procalcitonin was negative, no significant infiltrate noted on CT scan of his chest beyond compressive atelectatic change.  The effusion was lymphocyte predominant, exudative, culture negative.  Certainly this could have been a parapneumonic effusion but I think we have to evaluate for other possible causes.  He is a native [place] and I believe he needs a QuantiFERON gold.  We will perform autoimmune labs to screen for possible noninfectious causes.  He is at risk for lymphoma, had a subcarinal node on a CT scan of the chest.  We will repeat to better characterize the node.  He may merit biopsy or if he has recurrence of his pleural effusion then we may be able to pull perform flow cytometry on the cytology on repeat  thoracentesis.  We will perform lab work today We will repeat your chest x-ray today to evaluate your pleural fluid We will repeat your CT scan of the chest Follow with Dr. Lamonte Sakai next available after your testing is completed to review the results together  Mediastinal lymphadenopathy Cause unclear, could have been reactive given the recent illness.  Needs a repeat CT chest to characterize.  We could consider biopsy with endobronchial ultrasound depending on the results.  Baltazar Apo, MD, PhD 04/20/2019, 9:44 AM [facility] [phone] or if no answer [phone]

## 2019-04-20 NOTE — Patient Instructions (Signed)
We will perform lab work today We will repeat your chest x-ray today to evaluate your pleural fluid We will repeat your CT scan of the chest Follow with Dr. Lamonte Sakai next available after your testing is completed to review the results together.

## 2019-04-22 LAB — ANCA SCREEN W REFLEX TITER
ANCA Screen: POSITIVE — AB
C-ANCA Titer: 1:20 {titer} — ABNORMAL HIGH

## 2019-04-22 LAB — ANA: Anti Nuclear Antibody (ANA): POSITIVE — AB

## 2019-04-22 LAB — QUANTIFERON-TB GOLD PLUS
Mitogen-NIL: 7.75 IU/mL
NIL: 0.28 IU/mL
QuantiFERON-TB Gold Plus: POSITIVE — AB
TB1-NIL: 0.99 IU/mL
TB2-NIL: 1.19 IU/mL

## 2019-04-22 LAB — ANTI-NUCLEAR AB-TITER (ANA TITER): ANA Titer 1: 1:40 {titer} — ABNORMAL HIGH

## 2019-04-22 LAB — ANGIOTENSIN CONVERTING ENZYME: Angiotensin-Converting Enzyme: 77 U/L — ABNORMAL HIGH (ref 9–67)

## 2019-04-22 LAB — RHEUMATOID FACTOR: Rheumatoid fact SerPl-aCnc: <14 IU/mL (ref <14)

## 2019-05-05 ENCOUNTER — Ambulatory Visit (INDEPENDENT_AMBULATORY_CARE_PROVIDER_SITE_OTHER)
Admission: RE | Admit: 2019-05-05 | Discharge: 2019-05-05 | Disposition: A | Discharge status: Home / Self Care | Payer: PRIVATE HEALTH INSURANCE | Source: Ambulatory Visit | Attending: Emergency Medicine | Admitting: Emergency Medicine

## 2019-05-05 ENCOUNTER — Other Ambulatory Visit: Payer: Self-pay

## 2019-05-05 DIAGNOSIS — R590 Localized enlarged lymph nodes: Secondary | ICD-10-CM

## 2019-05-12 ENCOUNTER — Ambulatory Visit (INDEPENDENT_AMBULATORY_CARE_PROVIDER_SITE_OTHER): Payer: PRIVATE HEALTH INSURANCE | Admitting: Emergency Medicine

## 2019-05-12 ENCOUNTER — Other Ambulatory Visit: Payer: Self-pay

## 2019-05-12 ENCOUNTER — Encounter: Payer: Self-pay | Admitting: Emergency Medicine

## 2019-05-12 DIAGNOSIS — R590 Localized enlarged lymph nodes: Secondary | ICD-10-CM | POA: Diagnosis not present

## 2019-05-12 NOTE — H&P (View-Only) (Signed)
 Subjective:    Patient ID: Aaron Greer, male    DOB: 04/03/1981, 38 y.o.   MRN: [id number]  HPI 38-year-old former smoker (1 pack year), native of with little past medical history.  He was admitted April 24-27 for a large right pleural effusion and suspected associated CAP. He had fever and cough, HA that started about 4-5 days prior to that admission cough was non-productive. No fevers prior.  He was treated with antibiotics with some clinical improvement.  Interestingly his pro calcitonin was less than 0.1.  Thoracentesis was performed that showed a lymphocyte predominant exudate, culture negative, AFB negative.  Glucose 63, pH 7.5.  HIV negative, pneumococcal and Legionella urinary antigens both negative. Denies any trauma, chest injury. He lost wt during the febrile illness, has since gained it back.   A repeat thoracentesis was done on 5/27, again culture negative, AFB wasn't repeated. He was having pain and dyspnea. Postprocedural chest x-ray 5/27 showed almost full resolution of his right pleural effusion. Symptoms improved since. He can still feel occasional pleuritic pain, often at his thora site. No more fevers.   ROV 05/12/2019--38-year-old man who follows up today for right exudative and lymphocyte predominant pleural effusion originally found in April, underwent repeat thoracentesis in late May as above.  At his original visit we performed a's level that was slightly elevated at 77, ANCA screen that was c-ANCA positive (1: 20) ANA positive (1: 40), and QuantiFERON gold positive.  As above his original thoracentesis was AFB negative.  We repeated his CT scan of the chest on 05/05/2019 which I have reviewed and which shows stable mediastinal lymphadenopathy without any hilar adenopathy, right-sided pleural thickening with a small loculated right pleural effusion, smaller than on previous imaging.  There is patchy tree-in-bud opacity in the right upper lobe with some associated bronchiectatic  change that is for the most part stable compared with 02/13/2019.   He has occasional HA's, no fevers, no sweats. Occasional cough - less than prior.   ANCA- vasculitis, ? Effusion.  ? FOB for BAL and nodes.    Review of Systems  Constitutional: Negative for fever and unexpected weight change.  HENT: Negative for congestion, dental problem, ear pain, nosebleeds, postnasal drip, rhinorrhea, sinus pressure, sneezing, sore throat and trouble swallowing.   Eyes: Negative for redness and itching.  Respiratory: Negative for cough, chest tightness, shortness of breath and wheezing.   Cardiovascular: Negative for palpitations and leg swelling.  Gastrointestinal: Negative for nausea and vomiting.  Genitourinary: Negative for dysuria.  Musculoskeletal: Negative for joint swelling.  Skin: Negative for rash.  Neurological: Negative for headaches.  Hematological: Does not bruise/bleed easily.  Psychiatric/Behavioral: Negative for dysphoric mood. The patient is not nervous/anxious.     History reviewed. No pertinent past medical history.   History reviewed. No pertinent family history.  No hx lung disease, no hx CA or lymphoma in family.    Social History   Socioeconomic History  . Marital status: Married    Spouse name: Not on file  . Number of children: Not on file  . Years of education: Not on file  . Highest education level: Not on file  Occupational History  . Not on file  Social Needs  . Financial resource strain: Not on file  . Food insecurity    Worry: Not on file    Inability: Not on file  . Transportation needs    Medical: Not on file    Non-medical: Not on file  Tobacco Use  .   Smoking status: Former Smoker    Packs/day: 0.25    Years: 10.00    Pack years: 2.50    Types: Cigarettes    Quit date: 10/22/2008    Years since quitting: 10.5  . Smokeless tobacco: Never Used  Substance and Sexual Activity  . Alcohol use: Yes    Comment: Social  . Drug use: Never  .  Sexual activity: Yes  Lifestyle  . Physical activity    Days per week: Not on file    Minutes per session: Not on file  . Stress: Not on file  Relationships  . Social connections    Talks on phone: Not on file    Gets together: Not on file    Attends religious service: Not on file    Active member of club or organization: Not on file    Attends meetings of clubs or organizations: Not on file    Relationship status: Not on file  . Intimate partner violence    Fear of current or ex partner: Not on file    Emotionally abused: Not on file    Physically abused: Not on file    Forced sexual activity: Not on file  Other Topics Concern  . Not on file  Social History Narrative  . Not on file  Originally from [place], moved to [place] 2 yrs ago.  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  No known TB exposure.   No Known Allergies   Outpatient Medications Prior to Visit  Medication Sig Dispense Refill  . acetaminophen (TYLENOL) 500 MG tablet Take 1 tablet (500 mg total) by mouth every 6 (six) hours as needed for moderate pain (or Fever >/= 101). 20 tablet 0  . ferrous gluconate (FERGON) 324 MG tablet Take 1 tablet (324 mg total) by mouth daily with breakfast for 30 days. 30 tablet 0  . guaiFENesin-dextromethorphan (ROBITUSSIN DM) 100-10 MG/5ML syrup Take 5 mLs by mouth every 6 (six) hours as needed for cough. 118 mL 0   No facility-administered medications prior to visit.         Objective:   Physical Exam Vitals:   05/12/19 1423 05/12/19 1424  BP:  112/64  Pulse:  82  Temp: 97.7 F (36.5 C)   TempSrc: Oral   SpO2:  98%  Weight: 137 lb (62.1 kg)   Height: 5' 8" (1.727 m)    Gen: Pleasant, thin man, in no distress,  normal affect  ENT: No lesions,  mouth clear,  oropharynx clear, no postnasal drip  Neck: No JVD, no stridor  Lungs: No use of accessory muscles, better R air movement, no crackles or wheezes.    Cardiovascular: RRR, heart sounds normal, no murmur or gallops, no peripheral edema  Musculoskeletal: No deformities, no cyanosis or clubbing  Neuro: alert, awake, non focal  Skin: Warm, no lesions or rash      Assessment & Plan:  Mediastinal lymphadenopathy With his mediastinal lymphadenopathy, nodular right upper lobe infiltrate, positive QuantiFERON and I feel compelled to perform BAL and evaluate for possible AFB.  His pleural effusion was AFB negative in April.  We will arrange for bronchoscopy with Ebus in order to sample his mediastinal nodes, send culture data.  Of note his c-ANCA and ANA are both positive, consider relationship to his adenopathy.  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

## 2019-05-12 NOTE — Assessment & Plan Note (Signed)
With his mediastinal lymphadenopathy, nodular right upper lobe infiltrate, positive QuantiFERON and I feel compelled to perform BAL and evaluate for possible AFB.  His pleural effusion was AFB negative in April.  We will arrange for bronchoscopy with Ebus in order to sample his mediastinal nodes, send culture data.  Of note his c-ANCA and ANA are both positive, consider relationship to his adenopathy.  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

## 2019-05-12 NOTE — Patient Instructions (Signed)
We will arrange for bronchoscopy to evaluate for possible infection, evaluate your lymph nodes and lung tissue.  This is an outpatient procedure that is done at the hospital under anesthesia. Follow with Dr Lamonte Sakai in 1 month

## 2019-05-12 NOTE — Progress Notes (Signed)
Subjective:    Patient ID: Aaron Greer, male    DOB: 07/07/1981, 38 y.o.   MRN: [id number]  HPI 38 year old former smoker (1 pack year), native of with little past medical history.  He was admitted April 24-27 for a large right pleural effusion and suspected associated CAP. He had fever and cough, HA that started about 4-5 days prior to that admission cough was non-productive. No fevers prior.  He was treated with antibiotics with some clinical improvement.  Interestingly his pro calcitonin was less than 0.1.  Thoracentesis was performed that showed a lymphocyte predominant exudate, culture negative, AFB negative.  Glucose 63, pH 7.5.  HIV negative, pneumococcal and Legionella urinary antigens both negative. Denies any trauma, chest injury. He lost wt during the febrile illness, has since gained it back.   A repeat thoracentesis was done on 5/27, again culture negative, AFB wasn't repeated. He was having pain and dyspnea. Postprocedural chest x-ray 5/27 showed almost full resolution of his right pleural effusion. Symptoms improved since. He can still feel occasional pleuritic pain, often at his thora site. No more fevers.   ROV 05/12/2019--38 year old man who follows up today for right exudative and lymphocyte predominant pleural effusion originally found in April, underwent repeat thoracentesis in late May as above.  At his original visit we performed a's level that was slightly elevated at 77, ANCA screen that was c-ANCA positive (1: 20) ANA positive (1: 40), and QuantiFERON gold positive.  As above his original thoracentesis was AFB negative.  We repeated his CT scan of the chest on 05/05/2019 which I have reviewed and which shows stable mediastinal lymphadenopathy without any hilar adenopathy, right-sided pleural thickening with a small loculated right pleural effusion, smaller than on previous imaging.  There is patchy tree-in-bud opacity in the right upper lobe with some associated bronchiectatic  change that is for the most part stable compared with 02/13/2019.   He has occasional HA's, no fevers, no sweats. Occasional cough - less than prior.   ANCA- vasculitis, ? Effusion.  ? FOB for BAL and nodes.    Review of Systems  Constitutional: Negative for fever and unexpected weight change.  HENT: Negative for congestion, dental problem, ear pain, nosebleeds, postnasal drip, rhinorrhea, sinus pressure, sneezing, sore throat and trouble swallowing.   Eyes: Negative for redness and itching.  Respiratory: Negative for cough, chest tightness, shortness of breath and wheezing.   Cardiovascular: Negative for palpitations and leg swelling.  Gastrointestinal: Negative for nausea and vomiting.  Genitourinary: Negative for dysuria.  Musculoskeletal: Negative for joint swelling.  Skin: Negative for rash.  Neurological: Negative for headaches.  Hematological: Does not bruise/bleed easily.  Psychiatric/Behavioral: Negative for dysphoric mood. The patient is not nervous/anxious.     History reviewed. No pertinent past medical history.   History reviewed. No pertinent family history.  No hx lung disease, no hx CA or lymphoma in family.    Social History   Socioeconomic History  . Marital status: Married    Spouse name: Not on file  . Number of children: Not on file  . Years of education: Not on file  . Highest education level: Not on file  Occupational History  . Not on file  Social Needs  . Financial resource strain: Not on file  . Food insecurity    Worry: Not on file    Inability: Not on file  . Transportation needs    Medical: Not on file    Non-medical: Not on file  Tobacco Use  .  Smoking status: Former Smoker    Packs/day: 0.25    Years: 10.00    Pack years: 2.50    Types: Cigarettes    Quit date: 10/22/2008    Years since quitting: 10.5  . Smokeless tobacco: Never Used  Substance and Sexual Activity  . Alcohol use: Yes    Comment: Social  . Drug use: Never  .  Sexual activity: Yes  Lifestyle  . Physical activity    Days per week: Not on file    Minutes per session: Not on file  . Stress: Not on file  Relationships  . Social Herbalist on phone: Not on file    Gets together: Not on file    Attends religious service: Not on file    Active member of club or organization: Not on file    Attends meetings of clubs or organizations: Not on file    Relationship status: Not on file  . Intimate partner violence    Fear of current or ex partner: Not on file    Emotionally abused: Not on file    Physically abused: Not on file    Forced sexual activity: Not on file  Other Topics Concern  . Not on file  Social History Narrative  . Not on file  Originally from [place], moved to [place] 2 yrs ago.  Exposed to dust through [employer] work, wears Biomedical scientist.  Formerly a Architect.  No known TB exposure.   No Known Allergies   Outpatient Medications Prior to Visit  Medication Sig Dispense Refill  . acetaminophen (TYLENOL) 500 MG tablet Take 1 tablet (500 mg total) by mouth every 6 (six) hours as needed for moderate pain (or Fever >/= 101). 20 tablet 0  . ferrous gluconate (FERGON) 324 MG tablet Take 1 tablet (324 mg total) by mouth daily with breakfast for 30 days. 30 tablet 0  . guaiFENesin-dextromethorphan (ROBITUSSIN DM) 100-10 MG/5ML syrup Take 5 mLs by mouth every 6 (six) hours as needed for cough. 118 mL 0   No facility-administered medications prior to visit.         Objective:   Physical Exam Vitals:   05/12/19 1423 05/12/19 1424  BP:  112/64  Pulse:  82  Temp: 97.7 F (36.5 C)   TempSrc: Oral   SpO2:  98%  Weight: 137 lb (62.1 kg)   Height: _0  (1.727 m)    Gen: Pleasant, thin man, in no distress,  normal affect  ENT: No lesions,  mouth clear,  oropharynx clear, no postnasal drip  Neck: No JVD, no stridor  Lungs: No use of accessory muscles, better R air movement, no crackles or wheezes.    Cardiovascular: RRR, heart sounds normal, no murmur or gallops, no peripheral edema  Musculoskeletal: No deformities, no cyanosis or clubbing  Neuro: alert, awake, non focal  Skin: Warm, no lesions or rash      Assessment & Plan:  Mediastinal lymphadenopathy With his mediastinal lymphadenopathy, nodular right upper lobe infiltrate, positive QuantiFERON and I feel compelled to perform BAL and evaluate for possible AFB.  His pleural effusion was AFB negative in April.  We will arrange for bronchoscopy with [facility] in order to sample his mediastinal nodes, send culture data.  Of note his c-ANCA and ANA are both positive, consider relationship to his adenopathy.  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

## 2019-05-14 ENCOUNTER — Telehealth: Payer: Self-pay | Admitting: Emergency Medicine

## 2019-05-14 NOTE — Telephone Encounter (Signed)
Call returned to patient, he states he already has an appt 08/25 and he does not understand why the hospital is calling him about surgery on 07/30. I explained to the patient that during his OV on 07/21 RB spoke with him regarding a bronchoscopy. I explained that 08/25 if just a follow up office visit. His bronch is scheduled for 07/30. He states he was under the impression that his bronch was 08/25. I explained that, that is only a f/u OV. Voiced understanding. He voiced he was appreciative of me helping him have a better understanding. Nothing further is needed at this time.

## 2019-05-14 NOTE — Telephone Encounter (Signed)
LMTCB

## 2019-05-14 NOTE — Telephone Encounter (Signed)
Pt is calling back [phone]

## 2019-05-18 ENCOUNTER — Telehealth: Payer: Self-pay | Admitting: Emergency Medicine

## 2019-05-18 NOTE — Progress Notes (Signed)
[id number] [place], [place] [address] [place] [address] Phone: [phone] Fax: [facility] [place] [address] Phone: [phone] Fax: [phone]      Your procedure is scheduled on July 30th.  Report to [facility] Main Entrance "A" at 11:00 A.M., and check in at the Admitting office.  Call this number if you have problems the morning of surgery:  [phone]  Call [phone] if you have any questions prior to your surgery date Monday-Friday 8am-4pm    Remember:  Do not eat or drink after midnight the night before your surgery    Take these medicines the morning of surgery with A SIP OF WATER   Tylenol - if needed  7 days prior to surgery STOP taking any Aspirin (unless otherwise instructed by your surgeon), Aleve, Naproxen, Ibuprofen, Motrin, Advil, Goody's, BC's, all herbal medications, fish oil, and all vitamins.    The Morning of Surgery  Do not wear jewelry.  Do not wear lotions, powders, colognes, or deodorant  Men may shave face and neck.  Do not bring valuables to the hospital.  [facility] is not responsible for any belongings or valuables.  If you are a smoker, DO NOT Smoke 24 hours prior to surgery IF you wear a CPAP at night please bring your mask, tubing, and machine the morning of surgery   Remember that you must have someone to transport you home after your surgery, and remain with you for 24 hours if you are discharged the same day.   Contacts, glasses, hearing aids, dentures or bridgework may not be worn into surgery.    Leave your suitcase in the car.  After surgery it may be brought to your room.  For patients admitted to the hospital, discharge time will be determined by your treatment team.  Patients discharged the day of surgery will not be allowed to  drive home.    Special instructions:   [facility]- Preparing For Surgery  Before surgery, you can play an important role. Because skin is not sterile, your skin needs to be as free of germs as possible. You can reduce the number of germs on your skin by washing with CHG (chlorahexidine gluconate) Soap before surgery.  CHG is an antiseptic cleaner which kills germs and bonds with the skin to continue killing germs even after washing.    Oral Hygiene is also important to reduce your risk of infection.  Remember - BRUSH YOUR TEETH THE MORNING OF SURGERY WITH YOUR REGULAR TOOTHPASTE  Please do not use if you have an allergy to CHG or antibacterial soaps. If your skin becomes reddened/irritated stop using the CHG.  Do not shave (including legs and underarms) for at least 48 hours prior to first CHG shower. It is OK to shave your face.  Please follow these instructions carefully.   1. Shower the NIGHT BEFORE SURGERY and the MORNING OF SURGERY with CHG Soap.   2. If you chose to wash your hair, wash your hair first as usual with your normal shampoo.  3. After you shampoo, rinse your hair and body thoroughly to remove the shampoo.  4. Use CHG as you would any other liquid soap. You can apply CHG directly to the skin and wash gently with a scrungie or a clean washcloth.  5. Apply the CHG Soap to your body ONLY FROM THE NECK DOWN.  Do not use on open wounds or open sores. Avoid contact with your eyes, ears, mouth and genitals (private parts). Wash Face and genitals (private parts)  with your normal soap.   6. Wash thoroughly, paying special attention to the area where your surgery will be performed.  7. Thoroughly rinse your body with warm water from the neck down.  8. DO NOT shower/wash with your normal soap after using and rinsing off the CHG Soap.  9. Pat yourself dry with a CLEAN TOWEL.  10. Wear CLEAN PAJAMAS to bed the night before surgery, wear comfortable clothes the morning of  surgery  11. Place CLEAN SHEETS on your bed the night of your first shower and DO NOT SLEEP WITH PETS.    Day of Surgery:  Do not apply any deodorants/lotions. Please shower the morning of surgery with the CHG soap  Please wear clean clothes to the hospital/surgery center.   Remember to brush your teeth WITH YOUR REGULAR TOOTHPASTE.   Please read over the following fact sheets that you were given.

## 2019-05-18 NOTE — Telephone Encounter (Signed)
Patient dropped off FMLA paperwork to our office on Friday 05/15/2019 - fwd to [employer] via interoffice mail -pr

## 2019-05-19 ENCOUNTER — Other Ambulatory Visit (HOSPITAL_COMMUNITY): Payer: PRIVATE HEALTH INSURANCE

## 2019-05-19 ENCOUNTER — Encounter (HOSPITAL_COMMUNITY)
Admission: RE | Admit: 2019-05-19 | Discharge: 2019-05-19 | Disposition: A | Discharge status: Home / Self Care | Payer: PRIVATE HEALTH INSURANCE | Source: Ambulatory Visit | Attending: Emergency Medicine | Admitting: Emergency Medicine

## 2019-05-19 ENCOUNTER — Other Ambulatory Visit: Payer: Self-pay

## 2019-05-19 ENCOUNTER — Encounter (HOSPITAL_COMMUNITY): Payer: Self-pay

## 2019-05-19 ENCOUNTER — Other Ambulatory Visit (HOSPITAL_COMMUNITY)
Admission: RE | Admit: 2019-05-19 | Discharge: 2019-05-19 | Disposition: A | Discharge status: Home / Self Care | Payer: PRIVATE HEALTH INSURANCE | Source: Ambulatory Visit | Attending: Emergency Medicine | Admitting: Emergency Medicine

## 2019-05-19 DIAGNOSIS — R590 Localized enlarged lymph nodes: Secondary | ICD-10-CM | POA: Diagnosis not present

## 2019-05-19 DIAGNOSIS — Z87891 Personal history of nicotine dependence: Secondary | ICD-10-CM | POA: Diagnosis not present

## 2019-05-19 DIAGNOSIS — Z20828 Contact with and (suspected) exposure to other viral communicable diseases: Secondary | ICD-10-CM | POA: Insufficient documentation

## 2019-05-19 DIAGNOSIS — Z01812 Encounter for preprocedural laboratory examination: Secondary | ICD-10-CM | POA: Insufficient documentation

## 2019-05-19 DIAGNOSIS — R911 Solitary pulmonary nodule: Secondary | ICD-10-CM | POA: Diagnosis present

## 2019-05-19 DIAGNOSIS — J479 Bronchiectasis, uncomplicated: Secondary | ICD-10-CM | POA: Diagnosis not present

## 2019-05-19 DIAGNOSIS — J90 Pleural effusion, not elsewhere classified: Secondary | ICD-10-CM | POA: Diagnosis not present

## 2019-05-19 HISTORY — DX: Pneumonia, unspecified organism: J18.9

## 2019-05-19 HISTORY — DX: Other nonspecific abnormal finding of lung field: R91.8

## 2019-05-19 HISTORY — DX: Presence of spectacles and contact lenses: Z97.3

## 2019-05-19 HISTORY — DX: Localized enlarged lymph nodes: R59.0

## 2019-05-19 LAB — COMPREHENSIVE METABOLIC PANEL
ALT: 19 U/L (ref 0–44)
AST: 22 U/L (ref 15–41)
Albumin: 3.5 g/dL (ref 3.5–5.0)
Alkaline Phosphatase: 58 U/L (ref 38–126)
Anion gap: 9 (ref 5–15)
BUN: 13 mg/dL (ref 6–20)
CO2: 24 mmol/L (ref 22–32)
Calcium: 9.1 mg/dL (ref 8.9–10.3)
Chloride: 104 mmol/L (ref 98–111)
Creatinine, Ser: 0.86 mg/dL (ref 0.61–1.24)
GFR calc Af Amer: >60 mL/min (ref >60)
GFR calc non Af Amer: >60 mL/min (ref >60)
Glucose, Bld: 90 mg/dL (ref 70–99)
Potassium: 3.7 mmol/L (ref 3.5–5.1)
Sodium: 137 mmol/L (ref 135–145)
Total Bilirubin: 0.9 mg/dL (ref 0.3–1.2)
Total Protein: 8 g/dL (ref 6.5–8.1)

## 2019-05-19 LAB — SARS CORONAVIRUS 2 (TAT 6-24 HRS): SARS Coronavirus 2: NEGATIVE

## 2019-05-19 LAB — CBC
HCT: 45.3 % (ref 39.0–52.0)
Hemoglobin: 13.9 g/dL (ref 13.0–17.0)
MCH: 30 pg (ref 26.0–34.0)
MCHC: 30.7 g/dL (ref 30.0–36.0)
MCV: 97.6 fL (ref 80.0–100.0)
Platelets: 268 10*3/uL (ref 150–400)
RBC: 4.64 MIL/uL (ref 4.22–5.81)
RDW: 14.4 % (ref 11.5–15.5)
WBC: 5.6 10*3/uL (ref 4.0–10.5)
nRBC: 0 % (ref 0.0–0.2)

## 2019-05-19 LAB — PROTIME-INR
INR: 1.1 (ref 0.8–1.2)
Prothrombin Time: 14.5 seconds (ref 11.4–15.2)

## 2019-05-19 LAB — APTT: aPTT: 33 seconds (ref 24–36)

## 2019-05-19 NOTE — Progress Notes (Signed)
Pt denies SOB, chest pain and being under the care of a cardiologist. Pt stated that PCP is Lanae Boast, [place].   Pt denies having a stress test, echo and cardiac cath. Pt denies recent labs.   Pt denies that he and family tested positive for COVID-19; pt scheduled to be tested today and reminded to quarantine.   Coronavirus Screening  Pt denies that he and family members experienced the following symptoms:  Cough yes/no: No Fever (>100.3F)  yes/no: No Runny nose yes/no: No Sore throat yes/no: No Difficulty breathing/shortness of breath  yes/no: No  Have you or a family member traveled in the last 14 days and where? yes/no: No  Pt reminded that hospital visitation restrictions are in effect and the importance of the restrictions.   Pt verbalized understanding of all pre-op instructions.  Surgical Scheduler made aware to schedule a [place] interpreter for DOS. At 11:00 A.M.

## 2019-05-20 ENCOUNTER — Telehealth: Payer: Self-pay | Admitting: Emergency Medicine

## 2019-05-20 ENCOUNTER — Telehealth: Payer: Self-pay

## 2019-05-20 NOTE — Telephone Encounter (Signed)
Called spoke with patient, he was asking for a letter of covid test results for job. I asked that he have his job fax [place] whatever forms they need filled out. Thanks!

## 2019-05-20 NOTE — Telephone Encounter (Signed)
Yes you can give note (ill sign tomorrow) and let him known covid test was negative

## 2019-05-20 NOTE — Telephone Encounter (Signed)
Called and spoke w/ pt. Pt states he had a coronavirus test done recently for pre-procedural purposes and would like a letter for his work stating he needs to be quarantined for work. Pt states the results are still pending. He denies exposure, travel, and the cardinal symptoms. I let pt know that he would only need to be quarantined if he tested positive. Pt states his surgery is scheduled for tomorrow. I replied that if his surgery is tomorrow he should receive his results soon because pre-procedural protocol will not allow pt to undergo surgery with a pending coronavirus test. I let pt know I would get this message routed to a provider and get back with him with their recommendations. Pt verbalized understanding with no additional questions.  After the call, I saw that, according to pt's chart, he was, in fact, tested for coronavirus the result came back negative (has not been reviewed by a provider yet).    Since RB is not in the office, I am routing this message to [place]. Beth, please advise with your recommendations. Shall we result pt's coronavirus test? Thank you.

## 2019-05-21 ENCOUNTER — Encounter (HOSPITAL_COMMUNITY): Payer: Self-pay | Admitting: *Deleted

## 2019-05-21 ENCOUNTER — Other Ambulatory Visit: Payer: Self-pay

## 2019-05-21 ENCOUNTER — Encounter (HOSPITAL_COMMUNITY)
Admission: RE | Disposition: A | Discharge status: Home / Self Care | Payer: Self-pay | Source: Home / Self Care | Attending: Emergency Medicine

## 2019-05-21 ENCOUNTER — Ambulatory Visit (HOSPITAL_COMMUNITY): Payer: PRIVATE HEALTH INSURANCE | Admitting: Anesthesiology

## 2019-05-21 ENCOUNTER — Ambulatory Visit (HOSPITAL_COMMUNITY)
Admission: RE | Admit: 2019-05-21 | Discharge: 2019-05-21 | Disposition: A | Discharge status: Home / Self Care | Payer: PRIVATE HEALTH INSURANCE | Attending: Emergency Medicine | Admitting: Emergency Medicine

## 2019-05-21 DIAGNOSIS — R911 Solitary pulmonary nodule: Secondary | ICD-10-CM | POA: Insufficient documentation

## 2019-05-21 DIAGNOSIS — J479 Bronchiectasis, uncomplicated: Secondary | ICD-10-CM | POA: Diagnosis not present

## 2019-05-21 DIAGNOSIS — Z20828 Contact with and (suspected) exposure to other viral communicable diseases: Secondary | ICD-10-CM | POA: Insufficient documentation

## 2019-05-21 DIAGNOSIS — R590 Localized enlarged lymph nodes: Secondary | ICD-10-CM | POA: Insufficient documentation

## 2019-05-21 DIAGNOSIS — J90 Pleural effusion, not elsewhere classified: Secondary | ICD-10-CM | POA: Insufficient documentation

## 2019-05-21 DIAGNOSIS — Z87891 Personal history of nicotine dependence: Secondary | ICD-10-CM | POA: Insufficient documentation

## 2019-05-21 HISTORY — PX: VIDEO BRONCHOSCOPY WITH ENDOBRONCHIAL ULTRASOUND: SHX6177

## 2019-05-21 LAB — BODY FLUID CELL COUNT WITH DIFFERENTIAL
Eos, Fluid: 1 %
Eos, Fluid: 2 %
Lymphs, Fluid: 25 %
Lymphs, Fluid: 28 %
Monocyte-Macrophage-Serous Fluid: 59 % (ref 50–90)
Monocyte-Macrophage-Serous Fluid: 51 % (ref 50–90)
Neutrophil Count, Fluid: 15 % (ref 0–25)
Neutrophil Count, Fluid: 19 % (ref 0–25)
Total Nucleated Cell Count, Fluid: 27 cu mm (ref 0–1000)
Total Nucleated Cell Count, Fluid: 20 cu mm (ref 0–1000)

## 2019-05-21 SURGERY — BRONCHOSCOPY, WITH EBUS
Anesthesia: General

## 2019-05-21 MED ORDER — PHENYLEPHRINE 40 MCG/ML (10ML) SYRINGE FOR IV PUSH (FOR BLOOD PRESSURE SUPPORT)
PREFILLED_SYRINGE | INTRAVENOUS | Status: AC
  Filled 2019-05-21: qty 10

## 2019-05-21 MED ORDER — SUCCINYLCHOLINE CHLORIDE 200 MG/10ML IV SOSY
PREFILLED_SYRINGE | INTRAVENOUS | Status: DC | PRN
  Administered 2019-05-21: 60 mg via INTRAVENOUS

## 2019-05-21 MED ORDER — ROCURONIUM BROMIDE 50 MG/5ML IV SOSY
PREFILLED_SYRINGE | INTRAVENOUS | Status: DC | PRN
  Administered 2019-05-21: 30 mg via INTRAVENOUS

## 2019-05-21 MED ORDER — DEXAMETHASONE SODIUM PHOSPHATE 10 MG/ML IJ SOLN
INTRAMUSCULAR | Status: AC
  Filled 2019-05-21: qty 1

## 2019-05-21 MED ORDER — SUCCINYLCHOLINE CHLORIDE 200 MG/10ML IV SOSY
PREFILLED_SYRINGE | INTRAVENOUS | Status: AC
  Filled 2019-05-21: qty 10

## 2019-05-21 MED ORDER — FENTANYL CITRATE (PF) 100 MCG/2ML IJ SOLN: 25.0000 ug | INTRAMUSCULAR | Status: DC | PRN

## 2019-05-21 MED ORDER — LIDOCAINE 2% (20 MG/ML) 5 ML SYRINGE
INTRAMUSCULAR | Status: DC | PRN
  Administered 2019-05-21: 60 mg via INTRAVENOUS
  Administered 2019-05-21: 40 mg via INTRAVENOUS

## 2019-05-21 MED ORDER — ONDANSETRON HCL 4 MG/2ML IJ SOLN
INTRAMUSCULAR | Status: DC | PRN
  Administered 2019-05-21: 4 mg via INTRAVENOUS

## 2019-05-21 MED ORDER — 0.9 % SODIUM CHLORIDE (POUR BTL) OPTIME
TOPICAL | Status: DC | PRN
  Administered 2019-05-21: 13:00:00 1000 mL

## 2019-05-21 MED ORDER — SODIUM CHLORIDE 0.9 % IV SOLN
INTRAVENOUS | Status: DC | PRN
  Administered 2019-05-21: 60 ug/min via INTRAVENOUS

## 2019-05-21 MED ORDER — FENTANYL CITRATE (PF) 250 MCG/5ML IJ SOLN
INTRAMUSCULAR | Status: AC
  Filled 2019-05-21: qty 5

## 2019-05-21 MED ORDER — LACTATED RINGERS IV SOLN
INTRAVENOUS | Status: DC
  Administered 2019-05-21: 13:00:00 via INTRAVENOUS

## 2019-05-21 MED ORDER — ONDANSETRON HCL 4 MG/2ML IJ SOLN: 4.0000 mg | Freq: Once | INTRAMUSCULAR | Status: DC | PRN

## 2019-05-21 MED ORDER — LIDOCAINE 2% (20 MG/ML) 5 ML SYRINGE
INTRAMUSCULAR | Status: AC
  Filled 2019-05-21: qty 5

## 2019-05-21 MED ORDER — MIDAZOLAM HCL 2 MG/2ML IJ SOLN
INTRAMUSCULAR | Status: AC
  Filled 2019-05-21: qty 2

## 2019-05-21 MED ORDER — MIDAZOLAM HCL 5 MG/5ML IJ SOLN
INTRAMUSCULAR | Status: DC | PRN
  Administered 2019-05-21: 2 mg via INTRAVENOUS

## 2019-05-21 MED ORDER — ONDANSETRON HCL 4 MG/2ML IJ SOLN
INTRAMUSCULAR | Status: AC
  Filled 2019-05-21: qty 2

## 2019-05-21 MED ORDER — FENTANYL CITRATE (PF) 100 MCG/2ML IJ SOLN
INTRAMUSCULAR | Status: DC | PRN
  Administered 2019-05-21: 50 ug via INTRAVENOUS

## 2019-05-21 MED ORDER — OXYCODONE HCL 5 MG/5ML PO SOLN: 5.0000 mg | Freq: Once | ORAL | Status: DC | PRN

## 2019-05-21 MED ORDER — LACTATED RINGERS IV SOLN
INTRAVENOUS | Status: DC | PRN
  Administered 2019-05-21: 13:00:00 via INTRAVENOUS

## 2019-05-21 MED ORDER — OXYCODONE HCL 5 MG PO TABS: 5.0000 mg | ORAL_TABLET | Freq: Once | ORAL | Status: DC | PRN

## 2019-05-21 MED ORDER — PROPOFOL 10 MG/ML IV BOLUS
INTRAVENOUS | Status: DC | PRN
  Administered 2019-05-21: 130 mg via INTRAVENOUS
  Administered 2019-05-21: 40 mg via INTRAVENOUS

## 2019-05-21 MED ORDER — SUGAMMADEX SODIUM 200 MG/2ML IV SOLN
INTRAVENOUS | Status: DC | PRN
  Administered 2019-05-21: 200 mg via INTRAVENOUS

## 2019-05-21 MED ORDER — ROCURONIUM BROMIDE 10 MG/ML (PF) SYRINGE
PREFILLED_SYRINGE | INTRAVENOUS | Status: AC
  Filled 2019-05-21: qty 10

## 2019-05-21 MED ORDER — PROPOFOL 10 MG/ML IV BOLUS
INTRAVENOUS | Status: AC
  Filled 2019-05-21: qty 20

## 2019-05-21 MED ORDER — PHENYLEPHRINE 40 MCG/ML (10ML) SYRINGE FOR IV PUSH (FOR BLOOD PRESSURE SUPPORT)
PREFILLED_SYRINGE | INTRAVENOUS | Status: DC | PRN
  Administered 2019-05-21 (×2): 80 ug via INTRAVENOUS

## 2019-05-21 MED ORDER — DEXAMETHASONE SODIUM PHOSPHATE 10 MG/ML IJ SOLN
INTRAMUSCULAR | Status: DC | PRN
  Administered 2019-05-21: 8 mg via INTRAVENOUS

## 2019-05-21 SURGICAL SUPPLY — 34 items
ADAPTER VALVE BIOPSY EBUS (MISCELLANEOUS) IMPLANT
ADPTR VALVE BIOPSY EBUS (MISCELLANEOUS) ×2
BRUSH CYTOL CELLEBRITY 1.5X140 (MISCELLANEOUS) IMPLANT
CANISTER SUCT 3000ML PPV (MISCELLANEOUS) ×3 IMPLANT
CONT SPEC 4OZ CLIKSEAL STRL BL (MISCELLANEOUS) ×3 IMPLANT
COVER BACK TABLE 60X90IN (DRAPES) ×3 IMPLANT
FORCEPS BIOP RJ4 1.8 (CUTTING FORCEPS) IMPLANT
GAUZE SPONGE 4X4 12PLY STRL (GAUZE/BANDAGES/DRESSINGS) ×3 IMPLANT
GLOVE BIO SURGEON STRL SZ7.5 (GLOVE) ×3 IMPLANT
GOWN STRL REUS W/ TWL LRG LVL3 (GOWN DISPOSABLE) ×1 IMPLANT
GOWN STRL REUS W/TWL LRG LVL3 (GOWN DISPOSABLE) ×2
KIT CLEAN ENDO COMPLIANCE (KITS) ×6 IMPLANT
KIT TURNOVER KIT B (KITS) ×3 IMPLANT
MARKER SKIN DUAL TIP RULER LAB (MISCELLANEOUS) ×3 IMPLANT
NDL ASPIRATION VIZISHOT 19G (NEEDLE) IMPLANT
NDL ASPIRATION VIZISHOT 21G (NEEDLE) IMPLANT
NEEDLE ASPIRATION VIZISHOT 19G (NEEDLE) ×3 IMPLANT
NEEDLE ASPIRATION VIZISHOT 21G (NEEDLE) IMPLANT
NS IRRIG 1000ML POUR BTL (IV SOLUTION) ×3 IMPLANT
OIL SILICONE PENTAX (PARTS (SERVICE/REPAIRS)) ×3 IMPLANT
PAD ARMBOARD 7.5X6 YLW CONV (MISCELLANEOUS) ×6 IMPLANT
SYR 20ML ECCENTRIC (SYRINGE) ×6 IMPLANT
SYR 20ML LL LF (SYRINGE) ×6 IMPLANT
SYR 50ML SLIP (SYRINGE) ×2 IMPLANT
SYR 5ML LUER SLIP (SYRINGE) ×3 IMPLANT
TOWEL GREEN STERILE FF (TOWEL DISPOSABLE) ×3 IMPLANT
TRAP SPECIMEN MUCOUS 40CC (MISCELLANEOUS) ×4 IMPLANT
TUBE CONNECTING 20'X1/4 (TUBING) ×2
TUBE CONNECTING 20X1/4 (TUBING) ×4 IMPLANT
UNDERPAD 30X30 (UNDERPADS AND DIAPERS) ×3 IMPLANT
VALVE BIOPSY  SINGLE USE (MISCELLANEOUS) ×2
VALVE BIOPSY SINGLE USE (MISCELLANEOUS) ×1 IMPLANT
VALVE SUCTION BRONCHIO DISP (MISCELLANEOUS) ×3 IMPLANT
WATER STERILE IRR 1000ML POUR (IV SOLUTION) ×3 IMPLANT

## 2019-05-21 NOTE — Progress Notes (Signed)
Dr Lamonte Sakai spoke with pt and moments later his s/o re: procedure, findings, pending results. He also shared if pt experiences chest pain or SOB, he should go to the ED ( both pt and S/o <vi phone> acknowledged undertsanding) . For all other c/o's /  concernes he is told to f/u / call office.

## 2019-05-21 NOTE — Anesthesia Preprocedure Evaluation (Addendum)
Anesthesia Evaluation  Patient identified by MRN, date of birth, ID band Patient awake    Reviewed: Allergy & Precautions, NPO status , Patient's Chart, lab work & pertinent test results  History of Anesthesia Complications Negative for: history of anesthetic complications  Airway Mallampati: I  TM Distance: >3 FB Neck ROM: Full    Dental  (+) Teeth Intact   Pulmonary pneumonia (possible active TB infection), unresolved, former smoker,    Pulmonary exam normal        Cardiovascular negative cardio ROS Normal cardiovascular exam     Neuro/Psych negative neurological ROS  negative psych ROS   GI/Hepatic negative GI ROS, Neg liver ROS,   Endo/Other  negative endocrine ROS  Renal/GU negative Renal ROS  negative genitourinary   Musculoskeletal negative musculoskeletal ROS (+)   Abdominal   Peds  Hematology negative hematology ROS (+)   Anesthesia Other Findings   Reproductive/Obstetrics                            Anesthesia Physical Anesthesia Plan  ASA: III  Anesthesia Plan: General   Post-op Pain Management:    Induction: Intravenous and Rapid sequence  PONV Risk Score and Plan: 2 and Ondansetron, Dexamethasone, Midazolam and Treatment may vary due to age or medical condition  Airway Management Planned: Oral ETT  Additional Equipment: None  Intra-op Plan:   Post-operative Plan: Extubation in OR  Informed Consent: I have reviewed the patients History and Physical, chart, labs and discussed the procedure including the risks, benefits and alternatives for the proposed anesthesia with the patient or authorized representative who has indicated his/her understanding and acceptance.     Dental advisory given  Plan Discussed with:   Anesthesia Plan Comments:         Anesthesia Quick Evaluation

## 2019-05-21 NOTE — Op Note (Signed)
Video Bronchoscopy with Endobronchial Ultrasound Procedure Note  Date of Operation: 05/21/2019  Pre-op Diagnosis: Mediastinal adenopathy, bronchiectasis  Post-op Diagnosis: Same  Surgeon: Baltazar Apo  Assistants: None  Anesthesia: General endotracheal anesthesia  Operation: Flexible video fiberoptic bronchoscopy with endobronchial ultrasound and biopsies.  Estimated Blood Loss: Minimal  Complications: None apparent  Indications and History: Aaron Greer is a 38 y.o. male with history of a lymphocytic exudative pleural effusion, associated right-sided bronchiectatic change with micronodular disease, mediastinal adenopathy.  Unclear etiology, C ANCA and ANA positive, QuantiFERON gold positive.  Recommendation was made to achieve a tissue diagnosis via bronchoscopy, BAL, nodal biopsies.  The risks, benefits, complications, treatment options and expected outcomes were discussed with the patient.  The possibilities of pneumothorax, pneumonia, reaction to medication, pulmonary aspiration, perforation of a viscus, bleeding, failure to diagnose a condition and creating a complication requiring transfusion or operation were discussed with the patient who freely signed the consent.    Description of Procedure: The patient was examined in the preoperative area and history and data from the preprocedure consultation were reviewed. It was deemed appropriate to proceed.  The patient was taken to OR 11, identified as Dionicio Kammerer and the procedure verified as Flexible Video Fiberoptic Bronchoscopy.  A Time Out was held and the above information confirmed. After being taken to the operating room general anesthesia was initiated and the patient  was orally intubated. The video fiberoptic bronchoscope was introduced via the endotracheal tube and a general inspection was performed which showed normal airways throughout.  The apical segment of the right upper lobe was an accessory airway about 1-1/2 to 2 cm  proximal to the right upper lobe takeoff.  Bronchoalveolar lavage was performed in the apical segment and the posterior segment of the right upper lobe, as well as in the right middle lobe.  The samples will be sent for cytology, cell count, microbiology.  The standard scope was then withdrawn and the endobronchial ultrasound was used to identify and characterize the peritracheal, hilar and bronchial lymph nodes. Inspection showed enlargement at station 7 as well as station 10 R.  The nodes at station 4R were normal in size. Using real-time ultrasound guidance Wang needle biopsies were take from Station 7 and 10 R nodes and were sent for cytology as well as AFB culture. The patient tolerated the procedure well without apparent complications. There was no significant blood loss. The bronchoscope was withdrawn. Anesthesia was reversed and the patient was taken to the PACU for recovery.   Samples: 1. Wang needle biopsies from 7 node 2. Wang needle biopsies from 10 R node 3. Bronchoalveolar lavage from the right upper lobe (x2) and the right middle lobe  Plans:  The patient will be discharged from the PACU to home when recovered from anesthesia. We will review the cytology, pathology and microbiology results with the patient when they become available. Outpatient followup will be with Dr. Lamonte Sakai.    Baltazar Apo, MD, PhD 05/21/2019, 2:13 PM  Pulmonary [facility] Critical Care [phone] or if no answer [phone]

## 2019-05-21 NOTE — Telephone Encounter (Signed)
Left message for patient to call back  

## 2019-05-21 NOTE — Telephone Encounter (Signed)
Left message for patient to call back. Per the 7/27 message, the forms were sent to [employer] for completion.

## 2019-05-21 NOTE — Discharge Instructions (Signed)
Flexible Bronchoscopy, Care After This sheet gives you information about how to care for yourself after your test. Your doctor may also give you more specific instructions. If you have problems or questions, contact your doctor. Follow these instructions at home: Eating and drinking  Do not eat or drink anything (not even water) for 2 hours after your test, or until your numbing medicine (local anesthetic) wears off.  When your numbness is gone and your cough and gag reflexes have come back, you may: ? Eat only soft foods. ? Slowly drink liquids.  The day after the test, go back to your normal diet. Driving  Do not drive for 24 hours if you were given a medicine to help you relax (sedative).  Do not drive or use heavy machinery while taking prescription pain medicine. General instructions   Take over-the-counter and prescription medicines only as told by your doctor.  Return to your normal activities as told. Ask what activities are safe for you.  Do not use any products that have nicotine or tobacco in them. This includes cigarettes and e-cigarettes. If you need help quitting, ask your doctor.  Keep all follow-up visits as told by your doctor. This is important. It is very important if you had a tissue sample (biopsy) taken. Get help right away if:  You have shortness of breath that gets worse.  You get light-headed.  You feel like you are going to pass out (faint).  You have chest pain.  You cough up: ? More than a little blood. ? More blood than before. Summary  Do not eat or drink anything (not even water) for 2 hours after your test, or until your numbing medicine wears off.  Do not use cigarettes. Do not use e-cigarettes.  Get help right away if you have chest pain.  Please call our office for any questions or concerns.  [phone].   This information is not intended to replace advice given to you by your health care provider. Make sure you discuss any  questions you have with your health care provider. Document Released: 08/05/2009 Document Revised: 09/20/2017 Document Reviewed: 10/26/2016 Elsevier Patient Education  2020 [employer].

## 2019-05-21 NOTE — Interval H&P Note (Signed)
PCCM interval note  38 year old man presents for further evaluation of his mediastinal lymphadenopathy, right upper lobe nodular infiltrate, right pleural effusion.  Recommendation made to evaluate his lymphadenopathy via endobronchial ultrasound.  Also planning for BAL to obtain culture data.  To date all AFB samples have been negative.  His QuantiFERON gold is positive.  Vitals:   05/21/19 1106  BP: (!) 100/53  Pulse: 62  Resp: 20  Temp: 98.1 F (36.7 C)  TempSrc: Oral  SpO2: 100%  Weight: 60.9 kg  Height: _0  (1.727 m)  Comfortable thin gentleman, no distress. Oropharynx clear, M1 airway Lungs clear, no wheezing, slightly decreased on the right Heart regular without a murmur. Abdomen soft, benign with positive bowel sounds. Extremities without edema Neurologically intact   Bilateral mediastinal lymphadenopathy with some right upper lobe bronchiectasis and micronodular infiltrate, etiology unclear.  QuantiFERON gold positive.  C ANCA and ANA are both positive, again unclear cause.  Planning for bronchoscopy, endobronchial ultrasound, nodal biopsies and BAL.  Procedure discussed in detail with the patient and all questions answered.  He understands the risks, benefits and elected to proceed.  Baltazar Apo, MD, PhD 05/21/2019, 12:47 PM [facility] [phone] or if no answer [phone]

## 2019-05-21 NOTE — Anesthesia Procedure Notes (Signed)
Procedure Name: Intubation Date/Time: 05/21/2019 1:02 PM Performed by: Orlie Dakin, CRNA Pre-anesthesia Checklist: Patient identified, Emergency Drugs available, Suction available and Patient being monitored Patient Re-evaluated:Patient Re-evaluated prior to induction Oxygen Delivery Method: Circle system utilized Preoxygenation: Pre-oxygenation with 100% oxygen Induction Type: IV induction and Rapid sequence Laryngoscope Size: Miller and 3 Grade View: Grade I Tube type: Oral Tube size: 8.5 mm Number of attempts: 1 Airway Equipment and Method: Stylet Placement Confirmation: ETT inserted through vocal cords under direct vision,  positive ETCO2 and breath sounds checked- equal and bilateral Secured at: 24 cm Tube secured with: Tape Dental Injury: Teeth and Oropharynx as per pre-operative assessment  Comments: RSI due to possible +TB by labs. 4x4s bite block used at end of case.

## 2019-05-21 NOTE — Transfer of Care (Signed)
Immediate Anesthesia Transfer of Care Note  Patient: Aaron Greer  Procedure(s) Performed: VIDEO BRONCHOSCOPY WITH ENDOBRONCHIAL ULTRASOUND (N/A )  Patient Location: PACU  Anesthesia Type:General  Level of Consciousness: awake and patient cooperative  Airway & Oxygen Therapy: Patient Spontanous Breathing and Patient connected to face mask oxygen  Post-op Assessment: Report given to RN and Post -op Vital signs reviewed and stable  Post vital signs: Reviewed and stable  Last Vitals:  Vitals Value Taken Time  BP 94/65 05/21/19 1415  Temp    Pulse 87 05/21/19 1416  Resp 13 05/21/19 1416  SpO2 100 % 05/21/19 1416  Vitals shown include unvalidated device data.  Last Pain:  Vitals:   05/21/19 1232  TempSrc:   PainSc: 3       Patients Stated Pain Goal: 3 (95/63/87 5643)  Complications: No apparent anesthesia complications

## 2019-05-21 NOTE — Anesthesia Postprocedure Evaluation (Signed)
Anesthesia Post Note  Patient: Aaron Greer  Procedure(s) Performed: VIDEO BRONCHOSCOPY WITH ENDOBRONCHIAL ULTRASOUND (N/A )     Patient location during evaluation: PACU Anesthesia Type: General Level of consciousness: awake and alert Pain management: pain level controlled Vital Signs Assessment: post-procedure vital signs reviewed and stable Respiratory status: spontaneous breathing, nonlabored ventilation and respiratory function stable Cardiovascular status: blood pressure returned to baseline and stable Postop Assessment: no apparent nausea or vomiting Anesthetic complications: no    Last Vitals:  Vitals:   05/21/19 1430 05/21/19 1443  BP: 94/72 97/68  Pulse: 66   Resp: (!) 21   Temp:    SpO2: 100%     Last Pain:  Vitals:   05/21/19 1435  TempSrc:   PainSc: 0-No pain                 Lidia Collum

## 2019-05-22 ENCOUNTER — Encounter: Payer: Self-pay | Admitting: *Deleted

## 2019-05-22 ENCOUNTER — Telehealth: Payer: Self-pay

## 2019-05-22 ENCOUNTER — Encounter (HOSPITAL_COMMUNITY): Payer: Self-pay | Admitting: Emergency Medicine

## 2019-05-22 LAB — ACID FAST SMEAR (AFB, MYCOBACTERIA)
Acid Fast Smear: NEGATIVE
Acid Fast Smear: NEGATIVE
Acid Fast Smear: NEGATIVE
Acid Fast Smear: NEGATIVE
Acid Fast Smear: NEGATIVE
Source (AFB): 10
Source (AFB): 7

## 2019-05-22 NOTE — Telephone Encounter (Signed)
Called and spoke w/ pt. Pt had surgery yesterday 05/21/2019 at is aware that his COVID test is negative. I inquired if pt still needed a letter to his workplace. Pt states he will need a letter stating the day he had his coronavirus test performed, the results, and the days he will need to be quarantined.   Beth, you mentioned before that you were okay with signing a letter for this patient. Please advise if you are still okay with this and what the letter needs to say. I am unsure if pt understands he does not need to be quarantined if he is negative for COVID-19, despite verbalizing this to him multiple times. Thank you.

## 2019-05-22 NOTE — Telephone Encounter (Signed)
Pt returning call and can be reached @ [phone].Aaron Greer

## 2019-05-22 NOTE — Telephone Encounter (Signed)
I am fine signing. Ok to be out the days he needed to be quarantined for test and surgery date.

## 2019-05-22 NOTE — Telephone Encounter (Signed)
Rec'd FMLA paperwork via interoffice mail from [employer] - will hold until Monday 05/25/2019 when Dr. Lamonte Sakai will be back into the office -pr

## 2019-05-22 NOTE — Telephone Encounter (Signed)
Letter reprinted with correct dates for patients.

## 2019-05-22 NOTE — Telephone Encounter (Signed)
Called pt via pac[place] interpreter Letter done and up front for pick up per pt request  Nothing further needed

## 2019-05-23 LAB — CULTURE, RESPIRATORY W GRAM STAIN
Culture: NO GROWTH
Culture: NO GROWTH
Culture: NO GROWTH

## 2019-05-25 ENCOUNTER — Telehealth: Payer: Self-pay | Admitting: Emergency Medicine

## 2019-05-25 NOTE — Telephone Encounter (Signed)
Forms have been received and given to RB.

## 2019-05-25 NOTE — Telephone Encounter (Signed)
Spoke with patient. Advised him that based on the 7.27 note, the forms are here in the office waiting on RB's signature. He verbalized understanding. Nothing further needed at time of call.

## 2019-05-26 ENCOUNTER — Telehealth: Payer: Self-pay | Admitting: Emergency Medicine

## 2019-05-26 NOTE — Telephone Encounter (Signed)
Please let the patient know that there was no evidence for any cancer or infectionm on any of his lymph node biopsies. This is good news. We will continue to follow his final culture info. Thanks.

## 2019-05-26 NOTE — Telephone Encounter (Signed)
LMTCB x1 for pt.  

## 2019-05-27 NOTE — Telephone Encounter (Signed)
Forms have been given back to [place] by way of Benetta Spar, [place].

## 2019-05-27 NOTE — Telephone Encounter (Signed)
Rec'd completed forms - fwd to [employer] via interoffice mail -pr  °

## 2019-06-02 NOTE — Telephone Encounter (Signed)
LMTCB x2 for pt 

## 2019-06-16 ENCOUNTER — Encounter: Payer: Self-pay | Admitting: Emergency Medicine

## 2019-06-16 ENCOUNTER — Other Ambulatory Visit: Payer: Self-pay

## 2019-06-16 ENCOUNTER — Ambulatory Visit: Payer: PRIVATE HEALTH INSURANCE | Admitting: Emergency Medicine

## 2019-06-16 VITALS — BP 100/66 | HR 82 | Ht 68.0 in | Wt 135.0 lb

## 2019-06-16 DIAGNOSIS — R590 Localized enlarged lymph nodes: Secondary | ICD-10-CM

## 2019-06-16 NOTE — Patient Instructions (Signed)
We biopsies from bronchoscopy are all negative.  This is good news All culture information is negative as well.  This is almost finalized.  We will call you with final results. We will plan to repeat your CT scan of the chest and blood work in approximately 6 months. Follow with Dr. Lamonte Sakai in 6 months after your testing is done or sooner if you have any changes in your breathing, chest discomfort or any other problems.

## 2019-06-16 NOTE — Assessment & Plan Note (Signed)
Mediastinal lymphadenopathy, nodal biopsies by EBUS reassuring.  No granulomas, no evidence of malignancy.  He does have a history of some mild bronchiectatic change, may have had a pneumonia that led to both his right exudative effusion and lymphadenopathy.  All cultures are negative so far, AFB final result is pending.  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 Plan to repeat his CT scan of the chest to follow the adenopathy, pleural disease, mild right upper lobe inflammatory change in January 2021.  Also repeat ACE level, ANCA, ANA at that point as he did have some mild elevations, nonspecific at this time.

## 2019-06-16 NOTE — Progress Notes (Signed)
Subjective:    Patient ID: Aaron Greer, male    DOB: 02/13/1981, 38 y.o.   MRN: [id number]  HPI 38 year old former smoker (1 pack year), native of with little past medical history.  He was admitted April 24-27 for a large right pleural effusion and suspected associated CAP. He had fever and cough, HA that started about 4-5 days prior to that admission cough was non-productive. No fevers prior.  He was treated with antibiotics with some clinical improvement.  Interestingly his pro calcitonin was less than 0.1.  Thoracentesis was performed that showed a lymphocyte predominant exudate, culture negative, AFB negative.  Glucose 63, pH 7.5.  HIV negative, pneumococcal and Legionella urinary antigens both negative. Denies any trauma, chest injury. He lost wt during the febrile illness, has since gained it back.   A repeat thoracentesis was done on 5/27, again culture negative, AFB wasn't repeated. He was having pain and dyspnea. Postprocedural chest x-ray 5/27 showed almost full resolution of his right pleural effusion. Symptoms improved since. He can still feel occasional pleuritic pain, often at his thora site. No more fevers.   ROV 05/12/2019--38 year old man who follows up today for right exudative and lymphocyte predominant pleural effusion originally found in April, underwent repeat thoracentesis in late May as above.  At his original visit we performed a's level that was slightly elevated at 77, ANCA screen that was c-ANCA positive (1: 20) ANA positive (1: 40), and QuantiFERON gold positive.  As above his original thoracentesis was AFB negative.  We repeated his CT scan of the chest on 05/05/2019 which I have reviewed and which shows stable mediastinal lymphadenopathy without any hilar adenopathy, right-sided pleural thickening with a small loculated right pleural effusion, smaller than on previous imaging.  There is patchy tree-in-bud opacity in the right upper lobe with some associated bronchiectatic  change that is for the most part stable compared with 02/13/2019.   He has occasional HA's, no fevers, no sweats. Occasional cough - less than prior.   ROV 06/16/2019 -38 year old man who had a right exudative and lymphocyte predominant pleural effusion with 2 thoracenteses, slightly elevated ACE level, borderline positive ANCA, ANA.  Positive QuantiFERON without a known exposure.  He also had persistent mediastinal lymphadenopathy, hilar adenopathy on follow-up CT chest with some patchy tree-in-bud opacity in the right upper lobe (stable) etiology unclear.  He underwent bronchoscopy with endobronchial ultrasound on 05/21/2019.  All cytologies were normal including nodal biopsy at station 7 and 10 R.  Fungal, bacterial cultures are final and negative.  AFB negative so far. No cough, good exercise tolerance. He had an isolated episode R sided pain - no recurrence.      Review of Systems  Constitutional: Negative for fever and unexpected weight change.  HENT: Negative for congestion, dental problem, ear pain, nosebleeds, postnasal drip, rhinorrhea, sinus pressure, sneezing, sore throat and trouble swallowing.   Eyes: Negative for redness and itching.  Respiratory: Negative for cough, chest tightness, shortness of breath and wheezing.   Cardiovascular: Negative for palpitations and leg swelling.  Gastrointestinal: Negative for nausea and vomiting.  Genitourinary: Negative for dysuria.  Musculoskeletal: Negative for joint swelling.  Skin: Negative for rash.  Neurological: Negative for headaches.  Hematological: Does not bruise/bleed easily.  Psychiatric/Behavioral: Negative for dysphoric mood. The patient is not nervous/anxious.     Past Medical History:  Diagnosis Date   Lung nodules    RUL   Mediastinal lymphadenopathy    Pneumonia    Wears glasses  No family history on file.  No hx lung disease, no hx CA or lymphoma in family.    Social History   Socioeconomic History    Marital status: Married    Spouse name: Not on file   Number of children: Not on file   Years of education: Not on file   Highest education level: Not on file  Occupational History   Not on file  Social Needs   Financial resource strain: Not on file   Food insecurity    Worry: Not on file    Inability: Not on file   Transportation needs    Medical: Not on file    Non-medical: Not on file  Tobacco Use   Smoking status: Former Smoker    Packs/day: 0.25    Years: 10.00    Pack years: 2.50    Types: Cigarettes    Quit date: 10/22/2008    Years since quitting: 10.6   Smokeless tobacco: Never Used  Substance and Sexual Activity   Alcohol use: Yes    Comment: Social   Drug use: Never   Sexual activity: Yes  Lifestyle   Physical activity    Days per week: Not on file    Minutes per session: Not on file   Stress: Not on file  Relationships   Social connections    Talks on phone: Not on file    Gets together: Not on file    Attends religious service: Not on file    Active member of club or organization: Not on file    Attends meetings of clubs or organizations: Not on file    Relationship status: Not on file   Intimate partner violence    Fear of current or ex partner: Not on file    Emotionally abused: Not on file    Physically abused: Not on file    Forced sexual activity: Not on file  Other Topics Concern   Not on file  Social History Narrative   Not on file  Originally from [place], moved to [place] 2 yrs ago.  Exposed to dust through [place] work, wears Biomedical scientist.  Formerly a Architect.  No known TB exposure.   No Known Allergies   Outpatient Medications Prior to Visit  Medication Sig Dispense Refill   acetaminophen (TYLENOL) 500 MG tablet Take 1 tablet (500 mg total) by mouth every 6 (six) hours as needed for moderate pain (or Fever >/= 101). 20 tablet 0   ferrous sulfate 325 (65 FE) MG tablet Take 325 mg by mouth daily after  breakfast.     guaiFENesin-dextromethorphan (ROBITUSSIN DM) 100-10 MG/5ML syrup Take 5 mLs by mouth every 6 (six) hours as needed for cough. 118 mL 0   No facility-administered medications prior to visit.         Objective:   Physical Exam Vitals:   06/16/19 0934  BP: 100/66  Pulse: 82  SpO2: 97%  Weight: 61.2 kg  Height: 5\' 8"  (1.727 m)   Gen: Pleasant, thin man, in no distress,  normal affect  ENT: No lesions,  mouth clear,  oropharynx clear, no postnasal drip  Neck: No JVD, no stridor  Lungs: No use of accessory muscles, good R air movement, no crackles or wheezes.   Cardiovascular: RRR, heart sounds normal, no murmur or gallops, no peripheral edema  Musculoskeletal: No deformities, no cyanosis or clubbing  Neuro: alert, awake, non focal  Skin: Warm, no lesions or rash  Assessment & Plan:  Mediastinal lymphadenopathy Mediastinal lymphadenopathy, nodal biopsies by EBUS reassuring.  No granulomas, no evidence of malignancy.  He does have a history of some mild bronchiectatic change, may have had a pneumonia that led to both his right exudative effusion and lymphadenopathy.  All cultures are negative so far, AFB final result is pending.  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 Plan to repeat his CT scan of the chest to follow the adenopathy, pleural disease, mild right upper lobe inflammatory change in January 2021.  Also repeat ACE level, ANCA, ANA at that point as he did have some mild elevations, nonspecific at this time.    Baltazar Apo, MD, PhD 06/16/2019, 9:57 AM [facility] [phone] or if no answer [phone]

## 2019-06-22 LAB — FUNGAL ORGANISM REFLEX

## 2019-06-22 LAB — FUNGUS CULTURE WITH STAIN: Fungal Source: 10

## 2019-06-22 LAB — FUNGUS CULTURE RESULT

## 2019-06-24 LAB — FUNGUS CULTURE WITH STAIN

## 2019-06-24 LAB — FUNGUS CULTURE RESULT

## 2019-06-24 LAB — FUNGAL ORGANISM REFLEX

## 2019-07-01 ENCOUNTER — Encounter (HOSPITAL_COMMUNITY): Payer: Self-pay

## 2019-07-01 ENCOUNTER — Encounter (HOSPITAL_COMMUNITY): Payer: Self-pay | Admitting: *Deleted

## 2019-07-05 LAB — ACID FAST CULTURE WITH REFLEXED SENSITIVITIES (MYCOBACTERIA)
Acid Fast Culture: NEGATIVE
Acid Fast Culture: NEGATIVE
Acid Fast Culture: NEGATIVE
Acid Fast Culture: NEGATIVE
Acid Fast Culture: NEGATIVE
Source of Sample: 10
Source of Sample: 7

## 2019-07-17 ENCOUNTER — Ambulatory Visit: Payer: PRIVATE HEALTH INSURANCE | Admitting: Family Medicine

## 2019-08-11 ENCOUNTER — Ambulatory Visit (INDEPENDENT_AMBULATORY_CARE_PROVIDER_SITE_OTHER): Payer: PRIVATE HEALTH INSURANCE | Admitting: Family Medicine

## 2019-08-11 ENCOUNTER — Other Ambulatory Visit: Payer: Self-pay

## 2019-08-11 ENCOUNTER — Encounter: Payer: Self-pay | Admitting: Family Medicine

## 2019-08-11 VITALS — BP 118/50 | HR 83 | Temp 98.6°F | Resp 18 | Wt 138.1 lb

## 2019-08-11 DIAGNOSIS — R339 Retention of urine, unspecified: Secondary | ICD-10-CM

## 2019-08-11 DIAGNOSIS — D171 Benign lipomatous neoplasm of skin and subcutaneous tissue of trunk: Secondary | ICD-10-CM

## 2019-08-11 DIAGNOSIS — D508 Other iron deficiency anemias: Secondary | ICD-10-CM | POA: Diagnosis not present

## 2019-08-11 LAB — POCT URINALYSIS DIPSTICK
Bilirubin, UA: NEGATIVE
Blood, UA: NEGATIVE
Glucose, UA: NEGATIVE
Ketones, UA: NEGATIVE
Leukocytes, UA: NEGATIVE
Nitrite, UA: NEGATIVE
Protein, UA: NEGATIVE
Spec Grav, UA: 1.015 (ref 1.010–1.025)
Urobilinogen, UA: 0.2 E.U./dL
pH, UA: 6.5 (ref 5.0–8.0)

## 2019-08-11 MED ORDER — TAMSULOSIN HCL 0.4 MG PO CAPS: 0.4000 mg | ORAL_CAPSULE | Freq: Every day | ORAL | 0 refills | Status: DC

## 2019-08-11 NOTE — Patient Instructions (Addendum)
I have sent Floxmax 0.4 mg to your pharmacy, take once daily Right axillary skin eruption, we'll make a determination of whether we need to image. For right now, surveillance every 6 months. Please follow up sooner if area worsens.    Acute Urinary Retention, Male  Acute urinary retention means that you cannot pee (urinate) at all, or that you pee too little and your bladder is not emptied completely. If it is not treated, it can lead to kidney damage or other serious problems. Follow these instructions at home:  Take over-the-counter and prescription medicines only as told by your doctor. Ask your doctor what medicines you should stay away from. Do not take any medicine unless your doctor says it is okay to do so.  If you were sent home with a tube that drains the bladder (catheter), take care of it as told by your doctor.  Drink enough fluid to keep your pee clear or pale yellow.  If you were given an antibiotic, take it as told by your doctor. Do not stop taking the antibiotic even if you start to feel better.  Do not use any products that contain nicotine or tobacco, such as cigarettes and e-cigarettes. If you need help quitting, ask your doctor.  Watch for changes in your symptoms. Tell your doctor about them.  If told, track changes in your blood pressure at home. Tell your doctor about them.  Keep all follow-up visits as told by your doctor. This is important. Contact a doctor if:  You have spasms or you leak pee when you have spasms. Get help right away if:  You have chills or a fever.  You have a tube that drains the bladder and: ? The tube stops draining pee. ? The tube falls out.  You have blood in your pee. Summary  Acute urinary retention means that you have problems peeing. It may mean that you cannot pee at all, or that you pee too little.  If this condition is not treated, it can lead to kidney damage or other serious problems.  If you were sent home with a  tube that drains the bladder, take care of it as told by your doctor.  Monitor any changes in your symptoms. Tell your doctor about any changes. This information is not intended to replace advice given to you by your health care provider. Make sure you discuss any questions you have with your health care provider. Document Released: 03/26/2008 Document Revised: 12/25/2018 Document Reviewed: 11/09/2016 Elsevier Patient Education  Aaron Greer.   Anemia  Anemia is a condition in which you do not have enough red blood cells or hemoglobin. Hemoglobin is a substance in red blood cells that carries oxygen. When you do not have enough red blood cells or hemoglobin (are anemic), your body cannot get enough oxygen and your organs may not work properly. As a result, you may feel very tired or have other problems. What are the causes? Common causes of anemia include:  Excessive bleeding. Anemia can be caused by excessive bleeding inside or outside the body, including bleeding from the intestine or from periods in women.  Poor nutrition.  Long-lasting (chronic) kidney, thyroid, and liver disease.  Bone marrow disorders.  Cancer and treatments for cancer.  HIV (human immunodeficiency virus) and AIDS (acquired immunodeficiency syndrome).  Treatments for HIV and AIDS.  Spleen problems.  Blood disorders.  Infections, medicines, and autoimmune disorders that destroy red blood cells. What are the signs or symptoms? Symptoms  of this condition include:  Minor weakness.  Dizziness.  Headache.  Feeling heartbeats that are irregular or faster than normal (palpitations).  Shortness of breath, especially with exercise.  Paleness.  Cold sensitivity.  Indigestion.  Nausea.  Difficulty sleeping.  Difficulty concentrating. Symptoms may occur suddenly or develop slowly. If your anemia is mild, you may not have symptoms. How is this diagnosed? This condition is diagnosed based on:   Blood tests.  Your medical history.  A physical exam.  Bone marrow biopsy. Your health care provider may also check your stool (feces) for blood and may do additional testing to look for the cause of your bleeding. You may also have other tests, including:  Imaging tests, such as a CT scan or MRI.  Endoscopy.  Colonoscopy. How is this treated? Treatment for this condition depends on the cause. If you continue to lose a lot of blood, you may need to be treated at a hospital. Treatment may include:  Taking supplements of iron, vitamin P50, or folic acid.  Taking a hormone medicine (erythropoietin) that can help to stimulate red blood cell growth.  Having a blood transfusion. This may be needed if you lose a lot of blood.  Making changes to your diet.  Having surgery to remove your spleen. Follow these instructions at home:  Take over-the-counter and prescription medicines only as told by your health care provider.  Take supplements only as told by your health care provider.  Follow any diet instructions that you were given.  Keep all follow-up visits as told by your health care provider. This is important. Contact a health care provider if:  You develop new bleeding anywhere in the body. Get help right away if:  You are very weak.  You are short of breath.  You have pain in your abdomen or chest.  You are dizzy or feel faint.  You have trouble concentrating.  You have bloody or black, tarry stools.  You vomit repeatedly or you vomit up blood. Summary  Anemia is a condition in which you do not have enough red blood cells or enough of a substance in your red blood cells that carries oxygen (hemoglobin).  Symptoms may occur suddenly or develop slowly.  If your anemia is mild, you may not have symptoms.  This condition is diagnosed with blood tests as well as a medical history and physical exam. Other tests may be needed.  Treatment for this condition  depends on the cause of the anemia. This information is not intended to replace advice given to you by your health care provider. Make sure you discuss any questions you have with your health care provider. Document Released: 11/15/2004 Document Revised: 09/20/2017 Document Reviewed: 11/09/2016 Elsevier Patient Education  2020 [place] Antigen Test Why am I having this test? The prostate-specific antigen (PSA) test is a screening test for prostate cancer. It can identify early signs of prostate cancer, which may allow for more effective treatment. Your health care provider may recommend that you have a PSA test starting at age 43 or that you have one earlier or later, depending on your risk factors for prostate cancer. You may also have a PSA test:  To monitor treatment of prostate cancer.  To check whether prostate cancer has returned after treatment.  If you have signs of other conditions that can affect PSA levels, such as: ? An enlarged prostate that is not caused by cancer (benign prostatic hyperplasia, BPH). This condition is very common in older  men. ? A prostate infection. What is being tested? This test measures the amount of PSA in your blood. PSA is a protein that is made in the prostate. The prostate naturally produces more PSA as you age, but very high levels may be a sign of a medical condition. What kind of sample is taken?  A blood sample is required for this test. It is usually collected by inserting a needle into a blood vessel or by sticking a finger with a small needle. Blood for this test should be drawn before having an exam of the prostate. How do I prepare for this test? Do not ejaculate starting 24 hours before your test, or as long as told by your health care provider. Tell a health care provider about:  Any allergies you have.  All medicines you are taking, including vitamins, herbs, eye drops, creams, and over-the-counter medicines. This  also includes: ? Medicines to assist with hair growth, such as finasteride. ? Any recent exposure to a medicine called diethylstilbestrol.  Any blood disorders you have.  Any recent procedures you have had, especially any procedures involving the prostate or rectum.  Any medical conditions you have.  Any recent urinary tract infections (UTIs) you have had. How are the results reported? Your test results will be reported as a value that indicates how much PSA is in your blood. This will be given as nanograms of PSA per milliliter of blood (ng/mL). Your health care provider will compare your results to normal ranges that were established after testing a large group of people (reference ranges). Reference ranges may vary among labs and hospitals. PSA levels vary from person to person and generally increase with age. Because of this variation, there is no single PSA value that is considered normal for everyone. Instead, PSA reference ranges are used to describe whether your PSA levels are considered low or high (elevated). Common reference ranges are:  Low: 0-2.5 ng/mL.  Slightly to moderately elevated: 2.6-10.0 ng/mL.  Moderately elevated: 10.0-19.9 ng/mL.  Significantly elevated: 20 ng/mL or greater. Sometimes, the test results may report that a condition is present when it is not present (false-positive result). What do the results mean? A test result that is higher than 4 ng/mL may mean that you are at an increased risk for prostate cancer. However, a PSA test by itself is not enough to diagnose prostate cancer. High PSA levels may also be caused by the natural aging process, prostate infection, or BPH. PSA screening cannot tell you if your PSA is high due to cancer or a different cause. A prostate biopsy is the only way to diagnose prostate cancer. A risk of having the PSA test is diagnosing and treating prostate cancer that would never have caused any symptoms or problems (overdiagnosis  and overtreatment). Talk with your health care provider about what your results mean. Questions to ask your health care provider Ask your health care provider, or the department that is doing the test:  When will my results be ready?  How will I get my results?  What are my treatment options?  What other tests do I need?  What are my next steps? Summary  The prostate-specific antigen (PSA) test is a screening test for prostate cancer.  Your health care provider may recommend that you have a PSA test starting at age 33 or that you have one earlier or later, depending on your risk factors for prostate cancer.  A test result that is higher than 4 ng/mL  may mean that you are at an increased risk for prostate cancer. However, elevated levels can be caused by a number of conditions other than prostate cancer.  Talk with your health care provider about what your results mean. This information is not intended to replace advice given to you by your health care provider. Make sure you discuss any questions you have with your health care provider. Document Released: 11/10/2004 Document Revised: 09/20/2017 Document Reviewed: 07/15/2017 Elsevier Patient Education  2020 [employer].

## 2019-08-11 NOTE — Progress Notes (Signed)
Established Patient Office Visit  Subjective:  Patient ID: Aaron Greer, male    DOB: 12/23/1980  Age: 38 y.o. MRN: [id number]  CC:  Chief Complaint  Patient presents with  . Follow-up  . Recurrent Skin Infections    R side chest    HPI Aaron Greer, a 38 year old male with a history of mediastinal lymphadenopathy, history of right pleural effusion and bronchiectasis without complication, history of normocytic anemia, and history of lung nodules presents for 20-month follow-up.  Also, patient is complaining of a skin eruption to right chest that is been present for greater than 6 months.  He characterizes skin eruption as nonitching, nondraining, nontender. Patient states otherwise he feels well.  He is followed by pulmonology for mediastinal lymphadenopathy.  He last had noted biopsies that were reassuring.  He had no granulations and no evidence of malignancy.  Patient had negative cultures for AFB, however he had a positive QuantiFERON.  There was no evidence of tuberculosis per pulmonology.  The plan is to repeat patient's CT scan of the chest to follow his adenopathy, pleural disease, and mild right upper lobe inflammatory changes in the next several months. Patient currently denies fever, chills, recent travel, sick contacts, or exposure to COVID-19.  He also denies headache, dizziness, paresthesias,  Patient also complaining of urinary retention over the past several weeks.  Patient states that he has difficulty starting a urine stream.  He denies frequency, urgency, dysuria, or urinary incontinence.  Patient denies erectile dysfunction.  He has never had a prostate exam.  Skin erruption, elongated, movable, greater than 6 months, no tenderness with  Past Medical History:  Diagnosis Date  . Lung nodules    RUL  . Mediastinal lymphadenopathy   . Pneumonia   . Wears glasses     Past Surgical History:  Procedure Laterality Date  . IR THORACENTESIS ASP PLEURAL SPACE W/IMG GUIDE   03/18/2019  . LEG SURGERY    . VIDEO BRONCHOSCOPY WITH ENDOBRONCHIAL ULTRASOUND N/A 05/21/2019   Procedure: VIDEO BRONCHOSCOPY WITH ENDOBRONCHIAL ULTRASOUND;  Surgeon: Collene Gobble, MD;  Location: [facility];  Service: Thoracic;  Laterality: N/A;    No family history on file.  Social History   Socioeconomic History  . Marital status: Married    Spouse name: Not on file  . Number of children: Not on file  . Years of education: Not on file  . Highest education level: Not on file  Occupational History  . Not on file  Social Needs  . Financial resource strain: Not on file  . Food insecurity    Worry: Not on file    Inability: Not on file  . Transportation needs    Medical: Not on file    Non-medical: Not on file  Tobacco Use  . Smoking status: Former Smoker    Packs/day: 0.25    Years: 10.00    Pack years: 2.50    Types: Cigarettes    Quit date: 10/22/2008    Years since quitting: 10.8  . Smokeless tobacco: Never Used  Substance and Sexual Activity  . Alcohol use: Yes    Comment: Social  . Drug use: Never  . Sexual activity: Yes  Lifestyle  . Physical activity    Days per week: Not on file    Minutes per session: Not on file  . Stress: Not on file  Relationships  . Social Herbalist on phone: Not on file    Gets together: Not on  file    Attends religious service: Not on file    Active member of club or organization: Not on file    Attends meetings of clubs or organizations: Not on file    Relationship status: Not on file  . Intimate partner violence    Fear of current or ex partner: Not on file    Emotionally abused: Not on file    Physically abused: Not on file    Forced sexual activity: Not on file  Other Topics Concern  . Not on file  Social History Narrative  . Not on file    Outpatient Medications Prior to Visit  Medication Sig Dispense Refill  . acetaminophen (TYLENOL) 500 MG tablet Take 1 tablet (500 mg total) by mouth every 6 (six) hours as  needed for moderate pain (or Fever >/= 101). 20 tablet 0  . ferrous sulfate 325 (65 FE) MG tablet Take 325 mg by mouth daily after breakfast.    . guaiFENesin-dextromethorphan (ROBITUSSIN DM) 100-10 MG/5ML syrup Take 5 mLs by mouth every 6 (six) hours as needed for cough. 118 mL 0   No facility-administered medications prior to visit.     No Known Allergies  ROS Review of Systems    Objective:    Physical Exam  Constitutional: He is oriented to person, place, and time. He appears well-developed and well-nourished.  HENT:  Head: Normocephalic and atraumatic.  Eyes: Pupils are equal, round, and reactive to light.  Neck: Normal range of motion.  Cardiovascular: Normal rate and regular rhythm.  Pulmonary/Chest: Effort normal and breath sounds normal.  Abdominal: Soft. Bowel sounds are normal.  Musculoskeletal: Normal range of motion.  Neurological: He is alert and oriented to person, place, and time.  Skin:     1.0 cm, round, movable, non draining, non tender to palpition  Psychiatric: He has a normal mood and affect. His behavior is normal. Judgment and thought content normal.    BP (!) 118/50 (BP Location: Right Arm, Patient Position: Sitting, Cuff Size: Normal)   Pulse 83   Temp 98.6 F (37 C) (Oral)   Resp 18   Wt 138 lb 2 oz (62.7 kg)   SpO2 100%   BMI 21.00 kg/m  Wt Readings from Last 3 Encounters:  08/11/19 138 lb 2 oz (62.7 kg)  06/16/19 135 lb (61.2 kg)  05/21/19 134 lb 4.2 oz (60.9 kg)     Health Maintenance Due  Topic Date Due  . INFLUENZA VACCINE  05/23/2019    There are no preventive care reminders to display for this patient.  No results found for: TSH Lab Results  Component Value Date   WBC 5.6 05/19/2019   HGB 13.9 05/19/2019   HCT 45.3 05/19/2019   MCV 97.6 05/19/2019   PLT 268 05/19/2019   Lab Results  Component Value Date   NA 137 05/19/2019   K 3.7 05/19/2019   CO2 24 05/19/2019   GLUCOSE 90 05/19/2019   BUN 13 05/19/2019    CREATININE 0.86 05/19/2019   BILITOT 0.9 05/19/2019   ALKPHOS 58 05/19/2019   AST 22 05/19/2019   ALT 19 05/19/2019   PROT 8.0 05/19/2019   ALBUMIN 3.5 05/19/2019   CALCIUM 9.1 05/19/2019   ANIONGAP 9 05/19/2019   No results found for: CHOL No results found for: HDL No results found for: [facility] Lab Results  Component Value Date   TRIG 71 02/13/2019   No results found for: CHOLHDL No results found for: HGBA1C  Assessment & Plan:   Problem List Items Addressed This Visit    None    Visit Diagnoses    Urinary retention    -  Primary   Relevant Medications   tamsulosin (FLOMAX) 0.4 MG CAPS capsule   Other Relevant Orders   Urinalysis Dipstick (Completed)   PSA (Completed)   Iron deficiency anemia secondary to inadequate dietary iron intake       Relevant Orders   CBC (Completed)   Lipoma of chest wall         1. Urinary retention We will start a trial of Flomax to assist with urinary retention.  Follow-up with patient in 1 month.  Also, will do prostate exam at that time.  Discussed at length what an actual prostate exam consist.  Patient expressed understanding.  Will review laboratory results as they become available. - tamsulosin (FLOMAX) 0.4 MG CAPS capsule; Take 1 capsule (0.4 mg total) by mouth daily.  Dispense: 30 capsule; Refill: 0 - Urinalysis Dipstick - PSA  2. Iron deficiency anemia secondary to inadequate dietary iron intake Also, recommend an iron rich diet.  Discussed diet options at length. - CBC  3. Lipoma of chest wall Patient has what appears to be a 1 cm lipoma to right anterior chest.  Recommend surveillance, no interventions warranted at this time.   Follow-up:  Follow up in 1 month after starting medication.    Donia Pounds  APRN, MSN, FNP-C [facility] [address] [place], [place] [address] [phone]   This note was prepared using Dragon speech recognition software, errors in  dictation are unintentional.

## 2019-08-12 ENCOUNTER — Telehealth: Payer: Self-pay | Admitting: Family Medicine

## 2019-08-12 LAB — CBC
Hematocrit: 46.3 % (ref 37.5–51.0)
Hemoglobin: 15.3 g/dL (ref 13.0–17.7)
MCH: 30.4 pg (ref 26.6–33.0)
MCHC: 33 g/dL (ref 31.5–35.7)
MCV: 92 fL (ref 79–97)
Platelets: 246 10*3/uL (ref 150–450)
RBC: 5.04 x10E6/uL (ref 4.14–5.80)
RDW: 13.3 % (ref 11.6–15.4)
WBC: 6.1 10*3/uL (ref 3.4–10.8)

## 2019-08-12 LAB — PSA: Prostate Specific Ag, Serum: 1.5 ng/mL (ref 0.0–4.0)

## 2019-08-12 NOTE — Telephone Encounter (Signed)
Aaron Greer, a 38 year old male with a history of mediastinal lymphdenopathy and normocytic anemia was evaluated in the office for a 3 month follow up. Reviewed all laboratory values, within normal range. Communicated results to patient, he expressed understanding. He will follow up in office in 1 month.    Donia Pounds  APRN, MSN, FNP-C [facility] [address] [place], [place] [address] [phone]

## 2019-09-07 ENCOUNTER — Other Ambulatory Visit: Payer: Self-pay

## 2019-09-07 DIAGNOSIS — R339 Retention of urine, unspecified: Secondary | ICD-10-CM

## 2019-09-07 MED ORDER — TAMSULOSIN HCL 0.4 MG PO CAPS: 0.4000 mg | ORAL_CAPSULE | Freq: Every day | ORAL | 3 refills | Status: DC

## 2019-09-08 ENCOUNTER — Ambulatory Visit (INDEPENDENT_AMBULATORY_CARE_PROVIDER_SITE_OTHER): Payer: PRIVATE HEALTH INSURANCE | Admitting: Family Medicine

## 2019-09-08 ENCOUNTER — Other Ambulatory Visit: Payer: Self-pay

## 2019-09-08 ENCOUNTER — Encounter: Payer: Self-pay | Admitting: Family Medicine

## 2019-09-08 DIAGNOSIS — R339 Retention of urine, unspecified: Secondary | ICD-10-CM | POA: Diagnosis not present

## 2019-09-08 MED ORDER — TAMSULOSIN HCL 0.4 MG PO CAPS: 0.4000 mg | ORAL_CAPSULE | Freq: Every day | ORAL | 1 refills | Status: DC

## 2019-09-08 NOTE — Progress Notes (Signed)
Established Patient Office Visit  Subjective:  Patient ID: Aaron Greer, male    DOB: 1980-12-16  Age: 38 y.o. MRN: [id number]  CC:  Chief Complaint  Patient presents with  . Follow-up    1 month follow up for starting flomax     HPI Aaron Greer presents for a follow up of urinary retention. During office visit 1 month ago, patient presented complaining of urinary retention. Patient has never had a urinary catheter. He does not have a history of BPH. He also denies a history of prostate cancer. Patient had a difficult time starting a urine stream and felt that he was not emptying bladder completely. He was started on a trial of tamsulosin daily. He has been taking medication consistently and states that problem has since resolved. He denies urinary frequency, urgency, dysuria, or incontinence of urine or stool.    Past Medical History:  Diagnosis Date  . Lung nodules    RUL  . Mediastinal lymphadenopathy   . Pneumonia   . Wears glasses     Past Surgical History:  Procedure Laterality Date  . IR THORACENTESIS ASP PLEURAL SPACE W/IMG GUIDE  03/18/2019  . LEG SURGERY    . VIDEO BRONCHOSCOPY WITH ENDOBRONCHIAL ULTRASOUND N/A 05/21/2019   Procedure: VIDEO BRONCHOSCOPY WITH ENDOBRONCHIAL ULTRASOUND;  Surgeon: Collene Gobble, MD;  Location: [facility];  Service: Thoracic;  Laterality: N/A;    History reviewed. No pertinent family history.  Social History   Socioeconomic History  . Marital status: Married    Spouse name: Not on file  . Number of children: Not on file  . Years of education: Not on file  . Highest education level: Not on file  Occupational History  . Not on file  Social Needs  . Financial resource strain: Not on file  . Food insecurity    Worry: Not on file    Inability: Not on file  . Transportation needs    Medical: Not on file    Non-medical: Not on file  Tobacco Use  . Smoking status: Former Smoker    Packs/day: 0.25    Years: 10.00    Pack years:  2.50    Types: Cigarettes    Quit date: 10/22/2008    Years since quitting: 10.8  . Smokeless tobacco: Never Used  Substance and Sexual Activity  . Alcohol use: Yes    Comment: Social  . Drug use: Never  . Sexual activity: Yes  Lifestyle  . Physical activity    Days per week: Not on file    Minutes per session: Not on file  . Stress: Not on file  Relationships  . Social Herbalist on phone: Not on file    Gets together: Not on file    Attends religious service: Not on file    Active member of club or organization: Not on file    Attends meetings of clubs or organizations: Not on file    Relationship status: Not on file  . Intimate partner violence    Fear of current or ex partner: Not on file    Emotionally abused: Not on file    Physically abused: Not on file    Forced sexual activity: Not on file  Other Topics Concern  . Not on file  Social History Narrative  . Not on file    Outpatient Medications Prior to Visit  Medication Sig Dispense Refill  . acetaminophen (TYLENOL) 500 MG tablet Take 1 tablet (500  mg total) by mouth every 6 (six) hours as needed for moderate pain (or Fever >/= 101). 20 tablet 0  . ferrous sulfate 325 (65 FE) MG tablet Take 325 mg by mouth daily after breakfast.    . tamsulosin (FLOMAX) 0.4 MG CAPS capsule Take 1 capsule (0.4 mg total) by mouth daily. 30 capsule 3   No facility-administered medications prior to visit.     No Known Allergies  ROS Review of Systems    Objective:    Physical Exam  BP 111/63 (BP Location: Left Arm, Patient Position: Sitting, Cuff Size: Normal)   Pulse 70   Temp 98.2 F (36.8 C) (Oral)   Resp 14   Ht 5\' 8"  (1.727 m)   Wt 140 lb (63.5 kg)   SpO2 100%   BMI 21.29 kg/m  Wt Readings from Last 3 Encounters:  09/08/19 140 lb (63.5 kg)  08/11/19 138 lb 2 oz (62.7 kg)  06/16/19 135 lb (61.2 kg)     Health Maintenance Due  Topic Date Due  . INFLUENZA VACCINE  05/23/2019    There are no  preventive care reminders to display for this patient.  No results found for: TSH Lab Results  Component Value Date   WBC 6.1 08/11/2019   HGB 15.3 08/11/2019   HCT 46.3 08/11/2019   MCV 92 08/11/2019   PLT 246 08/11/2019   Lab Results  Component Value Date   NA 137 05/19/2019   K 3.7 05/19/2019   CO2 24 05/19/2019   GLUCOSE 90 05/19/2019   BUN 13 05/19/2019   CREATININE 0.86 05/19/2019   BILITOT 0.9 05/19/2019   ALKPHOS 58 05/19/2019   AST 22 05/19/2019   ALT 19 05/19/2019   PROT 8.0 05/19/2019   ALBUMIN 3.5 05/19/2019   CALCIUM 9.1 05/19/2019   ANIONGAP 9 05/19/2019   No results found for: CHOL No results found for: HDL No results found for: [facility] Lab Results  Component Value Date   TRIG 71 02/13/2019   No results found for: CHOLHDL No results found for: HGBA1C    Assessment & Plan:   Problem List Items Addressed This Visit    None    Visit Diagnoses    Urinary retention       Relevant Medications   tamsulosin (FLOMAX) 0.4 MG CAPS capsule     Urinary retention Will continue daily Tamsulosin. He will follow up in 6 months for a complete physical with prostate exam.  - tamsulosin (FLOMAX) 0.4 MG CAPS capsule; Take 1 capsule (0.4 mg total) by mouth daily.  Dispense: 90 capsule; Refill: 1  Follow-up: Return in about 6 months (around 03/07/2020) for complete physical examination.   Donia Pounds  APRN, MSN, FNP-C [facility] [address] [place], [place] [address] [phone]

## 2019-09-08 NOTE — Patient Instructions (Signed)
Tamsulosin capsules What is this medicine? TAMSULOSIN (tam SOO loe sin) is an alpha blocker. It is used to treat the signs and symptoms of an enlarged prostate in men. This condition is also called benign prostatic hyperplasia (BPH). This medicine may be used for other purposes; ask your health care provider or pharmacist if you have questions. COMMON BRAND NAME(S): Flomax What should I tell my health care provider before I take this medicine? They need to know if you have any of the following conditions:  advanced kidney disease  advanced liver disease  low blood pressure  prostate cancer  an unusual or allergic reaction to tamsulosin, sulfa drugs, other medicines, foods, dyes, or preservatives  pregnant or trying to get pregnant  breast-feeding How should I use this medicine? Take this medicine by mouth about 30 minutes after the same meal every day. Follow the directions on the prescription label. Swallow the capsules whole with a glass of water. Do not crush, chew, or open capsules. Do not take your medicine more often than directed. Do not stop taking your medicine unless your doctor tells you to. Talk to your pediatrician regarding the use of this medicine in children. Special care may be needed. Overdosage: If you think you have taken too much of this medicine contact a poison control center or emergency room at once. NOTE: This medicine is only for you. Do not share this medicine with others. What if I miss a dose? If you miss a dose, take it as soon as you can. If it is almost time for your next dose, take only that dose. Do not take double or extra doses. If you stop taking your medicine for several days or more, ask your doctor or health care professional what dose you should start back on. What may interact with this medicine?  cimetidine  fluoxetine  ketoconazole  medicines for erectile disfunction like sildenafil, tadalafil, vardenafil  medicines for high blood  pressure  other alpha-blockers like alfuzosin, doxazosin, phentolamine, phenoxybenzamine, prazosin, terazosin  warfarin This list may not describe all possible interactions. Give your health care provider a list of all the medicines, herbs, non-prescription drugs, or dietary supplements you use. Also tell them if you smoke, drink alcohol, or use illegal drugs. Some items may interact with your medicine. What should I watch for while using this medicine? Visit your doctor or health care professional for regular check ups. You will need lab work done before you start this medicine and regularly while you are taking it. Check your blood pressure as directed. Ask your health care professional what your blood pressure should be, and when you should contact him or her. This medicine may make you feel dizzy or lightheaded. This is more likely to happen after the first dose, after an increase in dose, or during hot weather or exercise. Drinking alcohol and taking some medicines can make this worse. Do not drive, use machinery, or do anything that needs mental alertness until you know how this medicine affects you. Do not sit or stand up quickly. If you begin to feel dizzy, sit down until you feel better. These effects can decrease once your body adjusts to the medicine. Contact your doctor or health care professional right away if you have an erection that lasts longer than 4 hours or if it becomes painful. This may be a sign of a serious problem and must be treated right away to prevent permanent damage. If you are thinking of having cataract surgery, tell your  eye surgeon that you have taken this medicine. What side effects may I notice from receiving this medicine? Side effects that you should report to your doctor or health care professional as soon as possible:  allergic reactions like skin rash or itching, hives, swelling of the lips, mouth, tongue, or throat  breathing problems  change in  vision  feeling faint or lightheaded  irregular heartbeat  prolonged or painful erection  weakness Side effects that usually do not require medical attention (report to your doctor or health care professional if they continue or are bothersome):  back pain  change in sex drive or performance  constipation, nausea or vomiting  cough  drowsy  runny or stuffy nose  trouble sleeping This list may not describe all possible side effects. Call your doctor for medical advice about side effects. You may report side effects to FDA at 1-800-FDA-1088. Where should I keep my medicine? Keep out of the reach of children. Store at room temperature between 15 and 30 degrees C (59 and 86 degrees F). Throw away any unused medicine after the expiration date. NOTE: This sheet is a summary. It may not cover all possible information. If you have questions about this medicine, talk to your doctor, pharmacist, or health care provider.  2020 Elsevier/Gold Standard (2018-03-13 12:54:06)  

## 2019-10-06 ENCOUNTER — Ambulatory Visit: Payer: PRIVATE HEALTH INSURANCE | Admitting: Family Medicine

## 2019-11-16 ENCOUNTER — Ambulatory Visit (INDEPENDENT_AMBULATORY_CARE_PROVIDER_SITE_OTHER)
Admission: RE | Admit: 2019-11-16 | Discharge: 2019-11-16 | Disposition: A | Discharge status: Home / Self Care | Payer: PRIVATE HEALTH INSURANCE | Source: Ambulatory Visit | Attending: Emergency Medicine | Admitting: Emergency Medicine

## 2019-11-16 ENCOUNTER — Other Ambulatory Visit: Payer: Self-pay

## 2019-11-16 DIAGNOSIS — R590 Localized enlarged lymph nodes: Secondary | ICD-10-CM

## 2019-11-16 MED ORDER — IOHEXOL 300 MG/ML  SOLN
80.0000 mL | Freq: Once | INTRAMUSCULAR | Status: AC | PRN
  Administered 2019-11-16: 10:00:00 80 mL via INTRAVENOUS

## 2019-12-31 ENCOUNTER — Other Ambulatory Visit: Payer: Self-pay

## 2019-12-31 DIAGNOSIS — R339 Retention of urine, unspecified: Secondary | ICD-10-CM

## 2019-12-31 MED ORDER — TAMSULOSIN HCL 0.4 MG PO CAPS: 0.4000 mg | ORAL_CAPSULE | Freq: Every day | ORAL | 1 refills | Status: DC

## 2020-03-08 ENCOUNTER — Ambulatory Visit: Payer: PRIVATE HEALTH INSURANCE | Admitting: Family Medicine

## 2020-07-12 ENCOUNTER — Other Ambulatory Visit: Payer: Self-pay | Admitting: Family Medicine

## 2020-07-12 DIAGNOSIS — R339 Retention of urine, unspecified: Secondary | ICD-10-CM

## 2020-10-21 ENCOUNTER — Other Ambulatory Visit: Payer: Self-pay | Admitting: Family Medicine

## 2020-10-21 DIAGNOSIS — R339 Retention of urine, unspecified: Secondary | ICD-10-CM

## 2020-12-07 DIAGNOSIS — R339 Retention of urine, unspecified: Secondary | ICD-10-CM

## 2020-12-08 MED ORDER — TAMSULOSIN HCL 0.4 MG PO CAPS: ORAL_CAPSULE | ORAL | 0 refills | Status: DC

## 2021-05-27 IMAGING — DX CHEST  1 VIEW
1 series · 1 of 1 positions shown · non-contrast
Comparison: Chest x-ray dated 03/10/2019.

CLINICAL DATA: Pleural effusion status post thoracentesis

EXAM:
CHEST  1 VIEW

[chest pa]
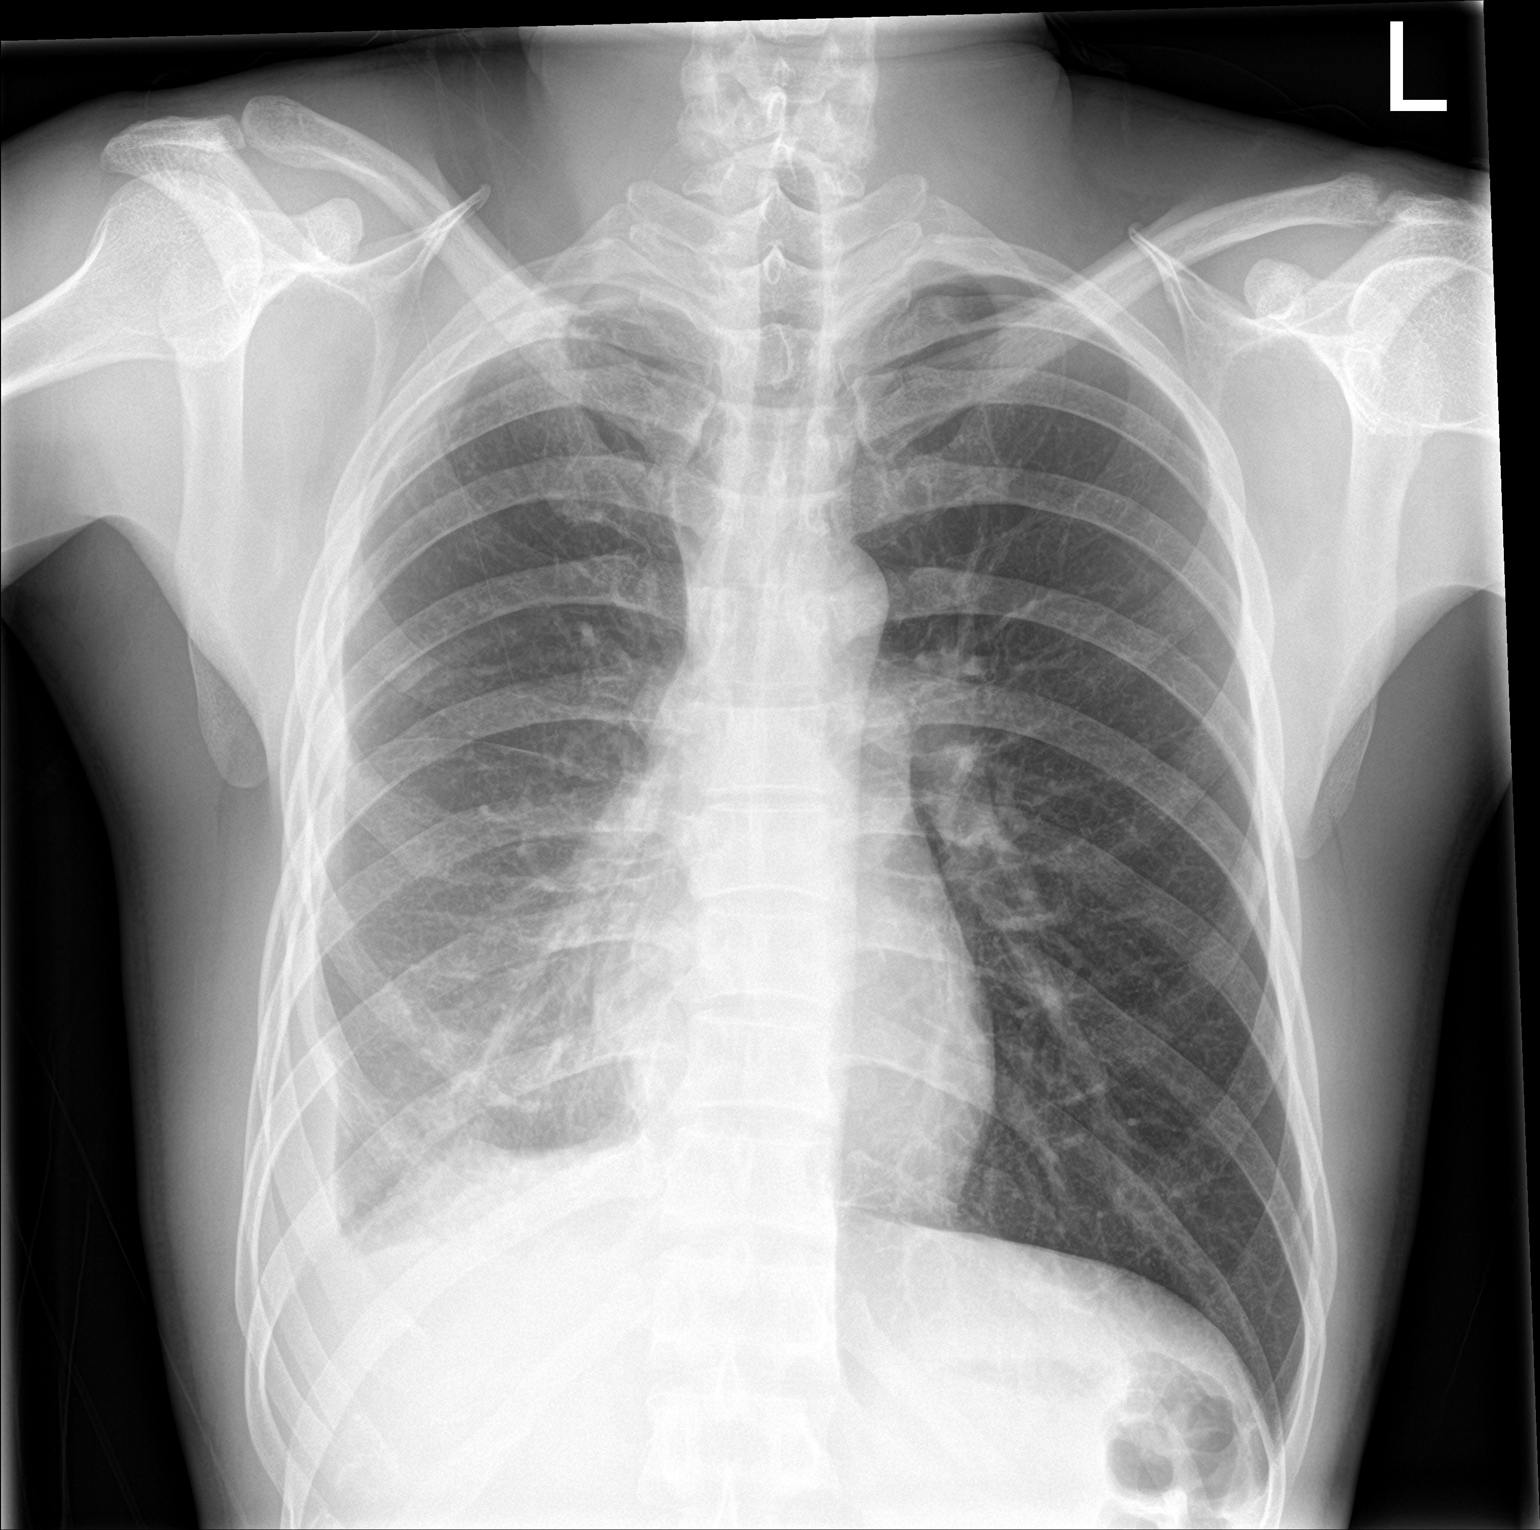

[1 of 1 positions shown; findings below may reference images not displayed]

FINDINGS: There is a small residual right-sided pleural effusion. No
pneumothorax. A right basilar airspace opacity is again noted. The
heart size is stable.
IMPRESSION: No pneumothorax status post right-sided thoracentesis. Slight
interval decrease in the right-sided pleural effusion when compared
to study dated 03/10/2019.

## 2021-05-27 IMAGING — US IR THORACENTESIS ASP PLEURAL SPACE W/IMG GUIDE
1 series · 3 of 3 positions shown · non-contrast
Comparison: None.

MEDICATIONS:
10 cc 1% lidocaine

COMPLICATIONS:
None immediate.

INDICATION: Symptomatic right sided pleural effusion

EXAM:
IR THORACENTESIS ASP PLEURAL SPACE W/IMG GUIDE
TECHNIQUE: Informed written consent was obtained from the patient after a
discussion of the risks, benefits and alternatives to treatment. A
timeout was performed prior to the initiation of the procedure.

[Series 1: ir (id) (id)/(id)/(id) ir · 3 of 3 slices shown]
[im 1/3]
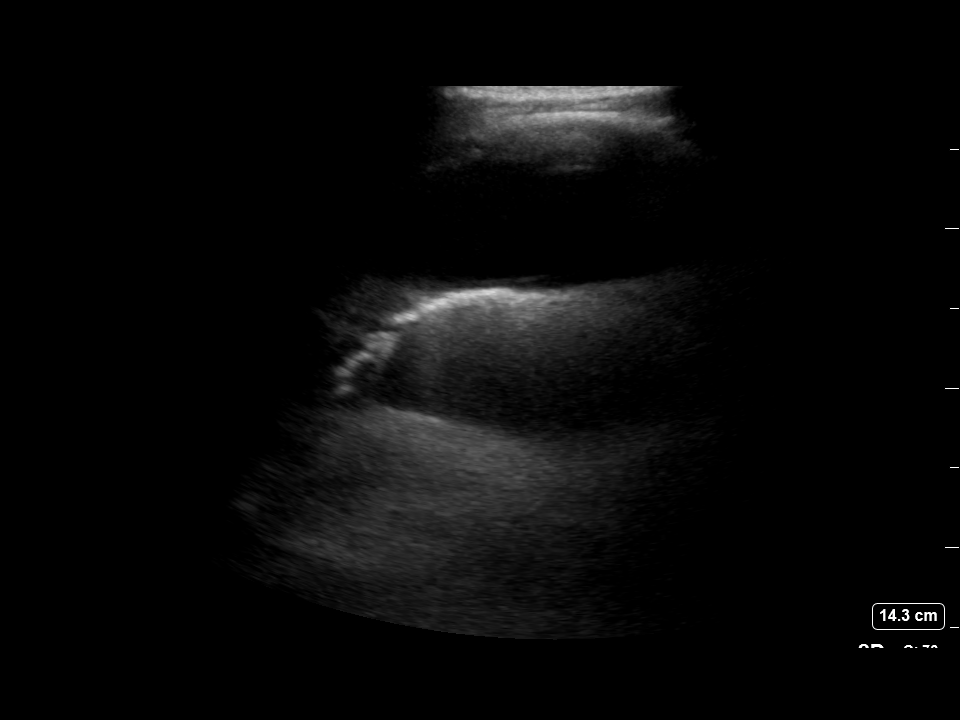
[im 2/3]
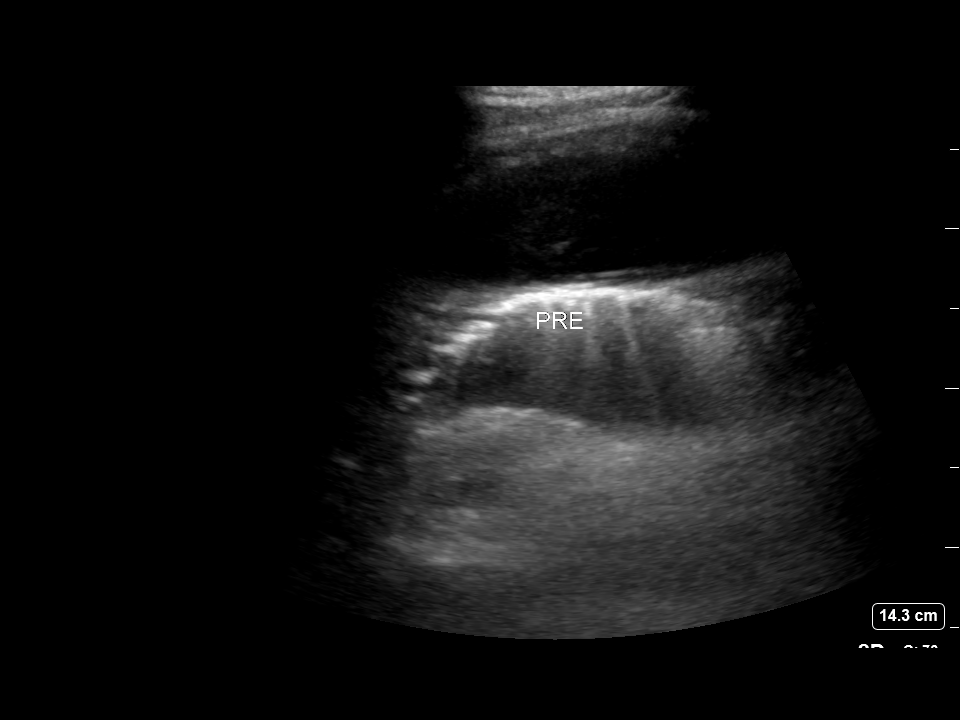
[im 3/3]
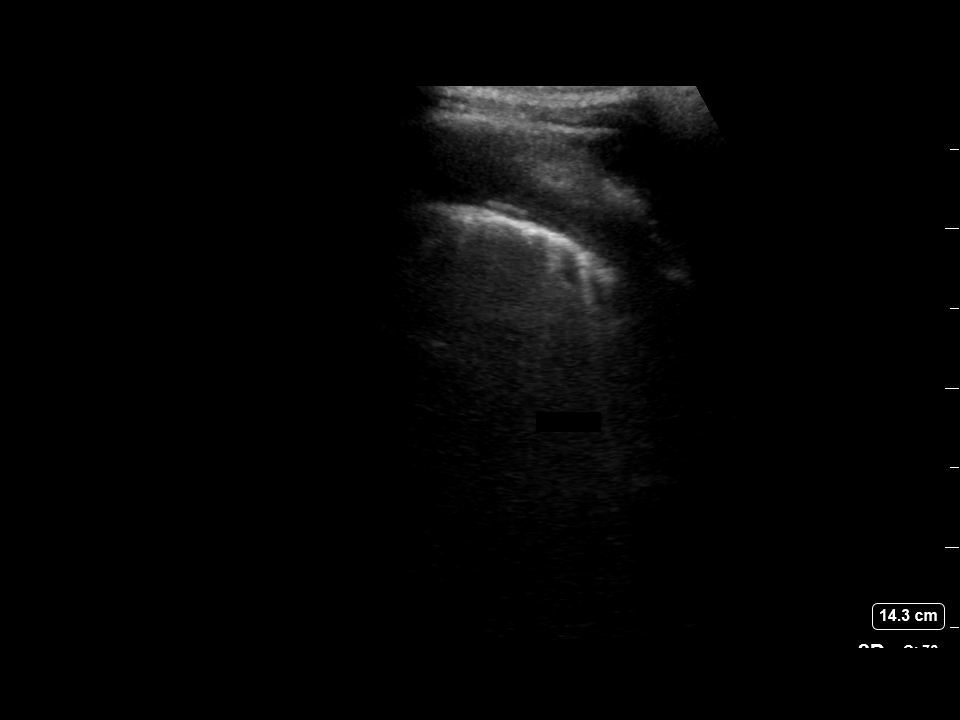

[3 of 3 positions shown; findings below may reference images not displayed]

Initial ultrasound scanning demonstrates a This is default text
pleural effusion. The lower chest was prepped and draped in the
usual sterile fashion. 1% lidocaine was used for local anesthesia.

Under direct ultrasound guidance, a 19 gauge, 7-cm, Yueh catheter
was introduced. An ultrasound image was saved for documentation
purposes. The thoracentesis was performed. The catheter was removed
and a dressing was applied. The patient tolerated the procedure well
without immediate post procedural complication. The patient was
escorted to have an upright chest radiograph.
FINDINGS: A total of approximately 60 cc of blood tinged fluid was removed.
Requested samples were sent to the laboratory.
IMPRESSION: Successful ultrasound-guided right sided thoracentesis yielding 60
cc of pleural fluid.

Read by

Herson Cuz

## 2021-06-22 ENCOUNTER — Other Ambulatory Visit: Payer: Self-pay

## 2021-06-22 DIAGNOSIS — R339 Retention of urine, unspecified: Secondary | ICD-10-CM

## 2021-07-14 IMAGING — CT CT CHEST WITHOUT CONTRAST
2 of 3 series · 15 of 36 positions shown, 18 images · non-contrast
Comparison: 02/13/2019 chest CT angiogram. 04/20/2019 chest
radiograph.

CLINICAL DATA: Follow-up right pleural effusion and mediastinal
lymphadenopathy.

EXAM:
CT CHEST WITHOUT CONTRAST
TECHNIQUE: Multidetector CT imaging of the chest was performed following the
standard protocol without IV contrast.

[Series 2: thorax · axial · 0.65mm/px · z∈[-351,-51]mm · 12 of 176 slices shown, 15 images]
[im 13/176  mediastinal]
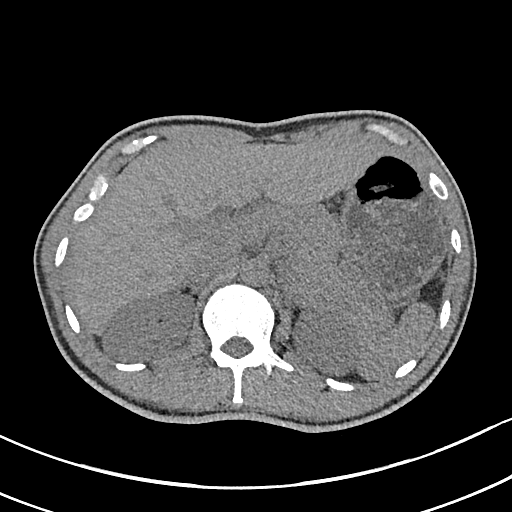
[im 13/176  lung]
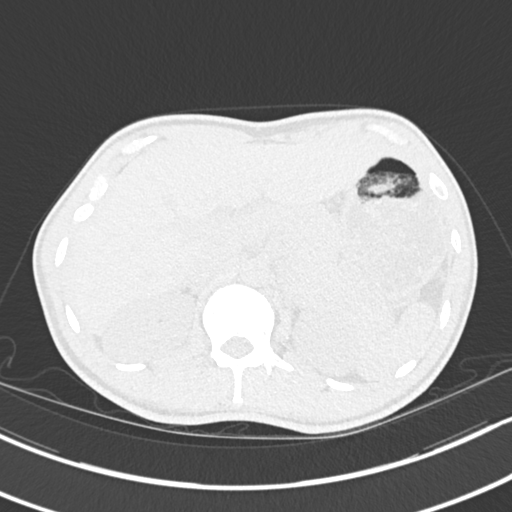
[im 26/176  lung]
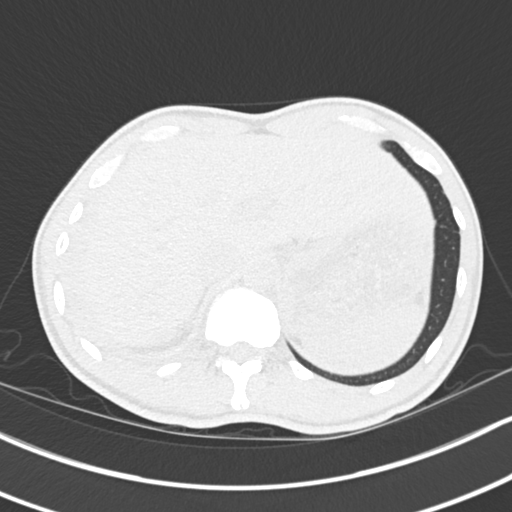
[im 39/176  lung]
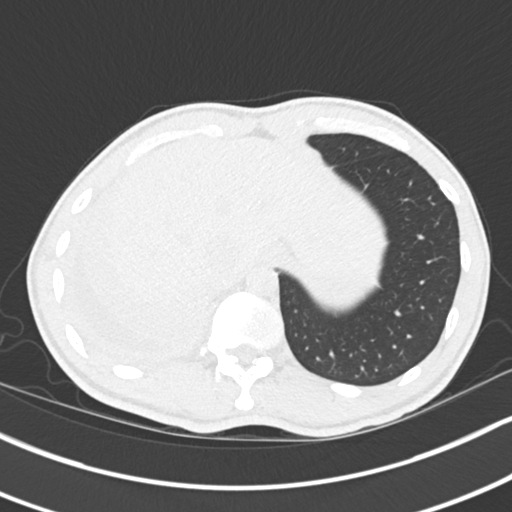
[im 52/176  lung]
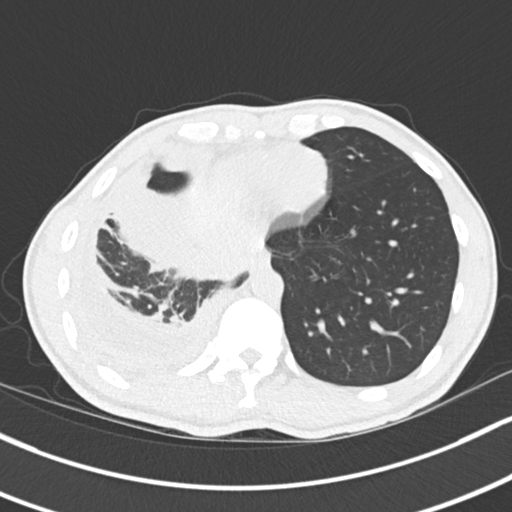
[im 65/176  mediastinal]
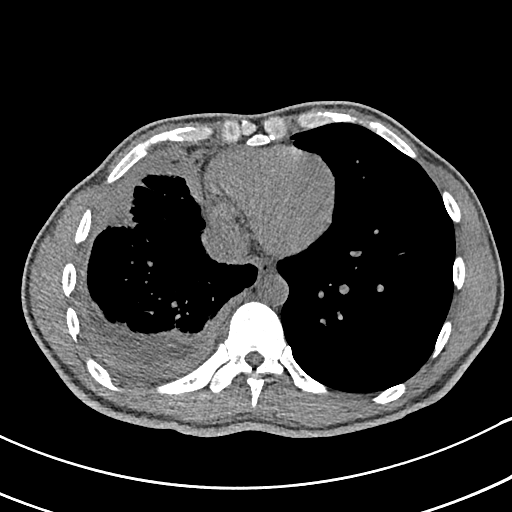
[im 65/176  lung]
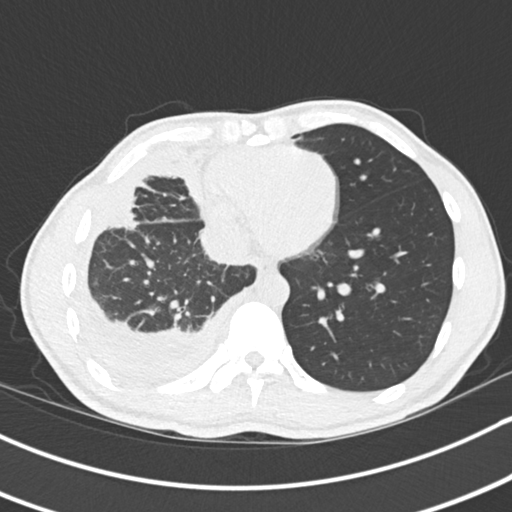
[im 78/176  lung]
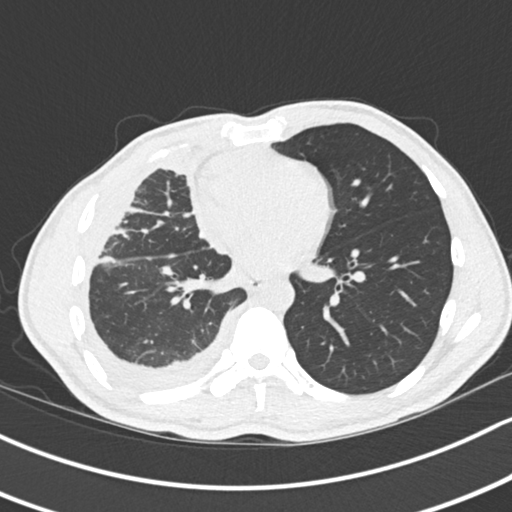
[im 98/176  lung]
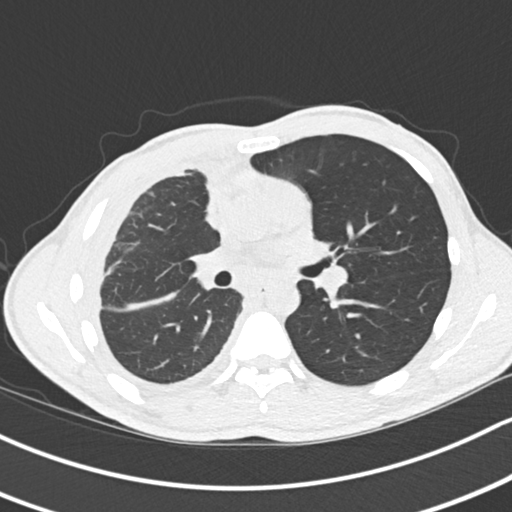
[im 111/176  lung]
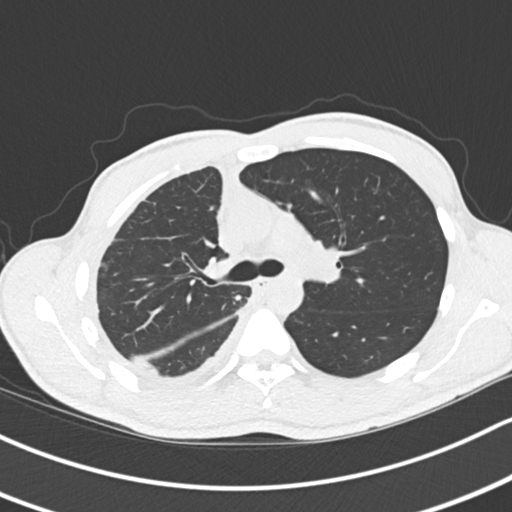
[im 124/176  mediastinal]
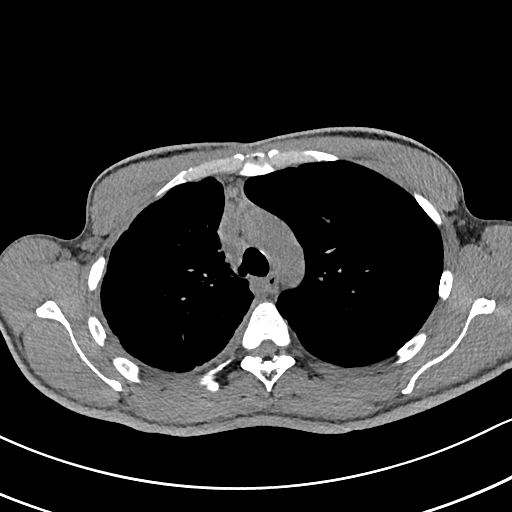
[im 124/176  lung]
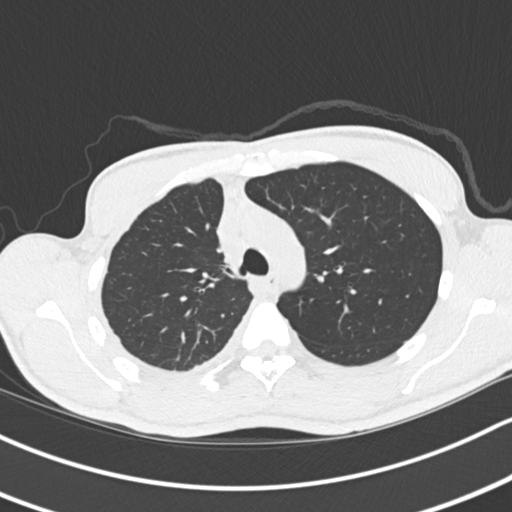
[im 137/176  lung]
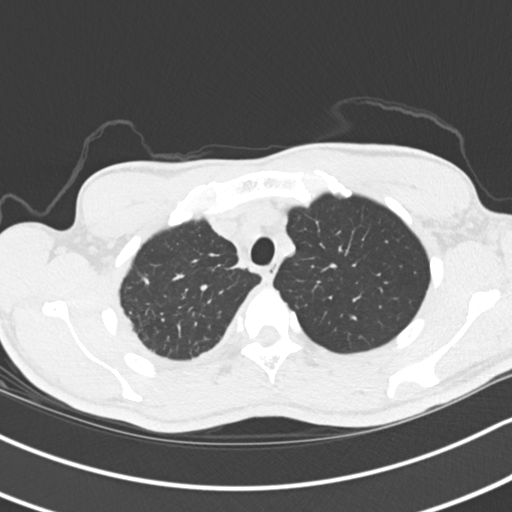
[im 150/176  lung]
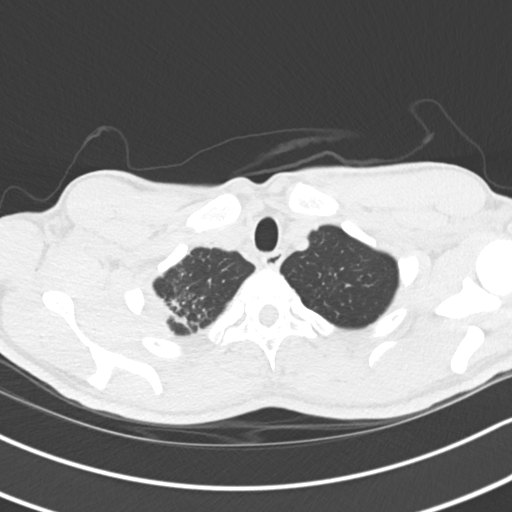
[im 163/176  lung]
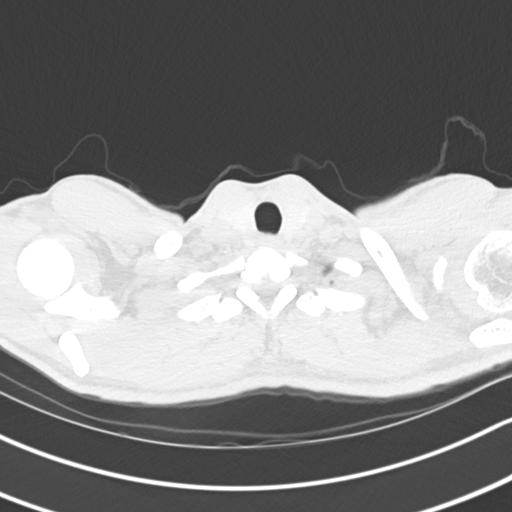

[Series 5: coronal · coronal · 0.68mm/px · 3 of 105 slices shown]
[im 21/105  lung]
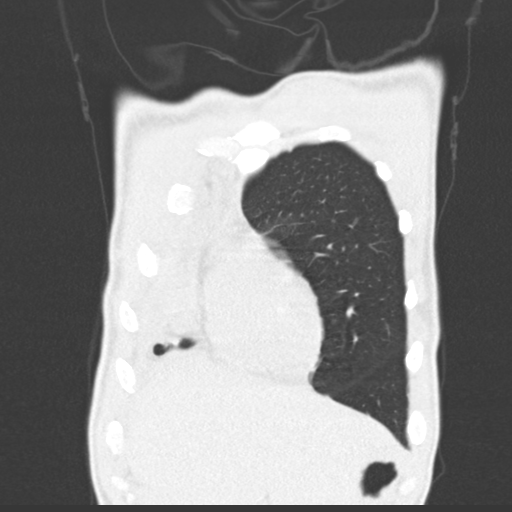
[im 42/105  lung]
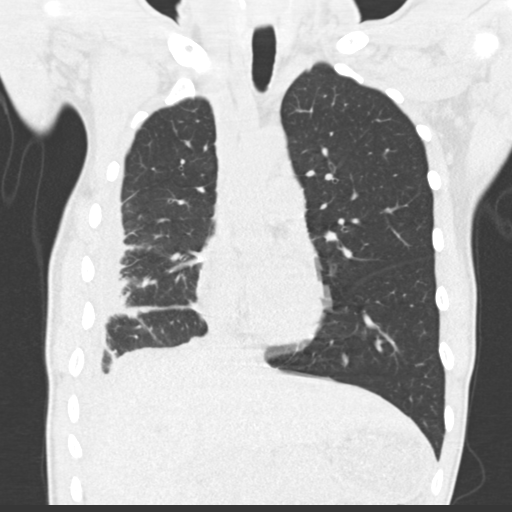
[im 63/105  lung]
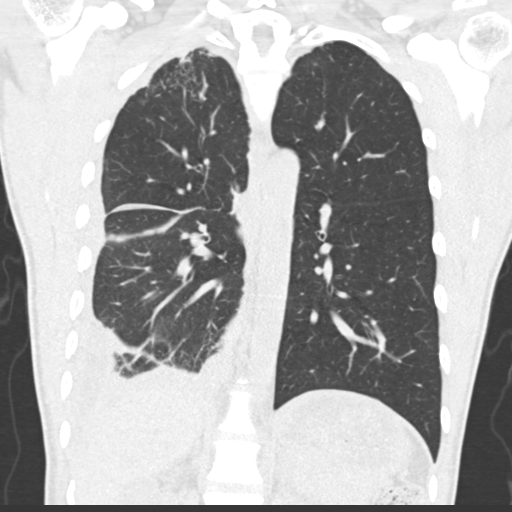

[15 of 36 positions shown; findings below may reference images not displayed]

FINDINGS: Cardiovascular: Normal heart size. No significant pericardial
effusion/thickening. Great vessels are normal in course and caliber.

Mediastinum/Nodes: No discrete thyroid nodules. Unremarkable
esophagus. No axillary adenopathy. Stable mildly enlarged 1.0 cm
right prevascular node (series 2/image 49). Stable mildly enlarged
1.0 cm right paratracheal node (series 2/image 47). Stable mildly
enlarged 1.2 cm subcarinal node (series 2/image 76). Stable mildly
prominent 0.9 cm right pericardiophrenic node (series 2/image 128).
No new pathologically enlarged mediastinal nodes. No discrete hilar
adenopathy on this noncontrast scan.

Lungs/Pleura: No pneumothorax. Smooth circumferential right pleural
thickening measuring up to 0.8 cm thickness (series 2/image 131),
increased from 0.4 cm. Small loculated right pleural effusion is
decreased from 02/13/2019 chest CT. No left pleural effusion. No
pleural calcifications. Patchy tree-in-bud opacities throughout the
right upper lobe with associated mild cylindrical and varicoid
bronchiectasis, most prominent in the apical right upper lobe,
unchanged. Extensive parenchymal banding with some distortion and
volume loss throughout the peripheral mid to lower right lung. No
acute consolidative airspace disease, lung masses or significant
pulmonary nodules.

Upper abdomen: No acute abnormality.

Musculoskeletal:  No aggressive appearing focal osseous lesions.
IMPRESSION: 1. Stable mild right upper lobe bronchiectasis with patchy
tree-in-bud opacity compatible with nonspecific chronic infectious
or inflammatory bronchiolitis.
2. Persistent circumferential smooth right pleural thickening,
increased in thickness. Loculated small right pleural effusion.
Pleural-parenchymal scarring with volume loss throughout the mid to
lower right lung.
3. Persistent nonspecific mild mediastinal lymphadenopathy, not
appreciably changed. Suggest continued chest CT follow-up in 3-6
months, preferably with IV contrast.

## 2021-12-04 ENCOUNTER — Ambulatory Visit (HOSPITAL_COMMUNITY): Payer: PRIVATE HEALTH INSURANCE

## 2022-01-19 ENCOUNTER — Ambulatory Visit: Payer: Managed Care, Other (non HMO) | Admitting: Nurse Practitioner

## 2022-01-19 ENCOUNTER — Encounter: Payer: Self-pay | Admitting: Nurse Practitioner

## 2022-01-19 VITALS — BP 116/77 | HR 70 | Temp 99.0°F | Ht 68.0 in | Wt 149.1 lb

## 2022-01-19 DIAGNOSIS — R339 Retention of urine, unspecified: Secondary | ICD-10-CM

## 2022-01-19 DIAGNOSIS — L2089 Other atopic dermatitis: Secondary | ICD-10-CM | POA: Diagnosis not present

## 2022-01-19 DIAGNOSIS — F524 Premature ejaculation: Secondary | ICD-10-CM

## 2022-01-19 DIAGNOSIS — L853 Xerosis cutis: Secondary | ICD-10-CM

## 2022-01-19 DIAGNOSIS — Z0000 Encounter for general adult medical examination without abnormal findings: Secondary | ICD-10-CM

## 2022-01-19 LAB — POCT URINALYSIS DIP (CLINITEK)
Bilirubin, UA: NEGATIVE
Glucose, UA: NEGATIVE mg/dL
Ketones, POC UA: NEGATIVE mg/dL
Leukocytes, UA: NEGATIVE
Nitrite, UA: NEGATIVE
POC PROTEIN,UA: NEGATIVE
Spec Grav, UA: 1.015 (ref 1.010–1.025)
Urobilinogen, UA: 0.2 E.U./dL
pH, UA: 5.5 (ref 5.0–8.0)

## 2022-01-19 MED ORDER — TRIAMCINOLONE ACETONIDE 0.1 % EX CREA: 1.0000 "application " | TOPICAL_CREAM | Freq: Two times a day (BID) | CUTANEOUS | 0 refills | Status: DC

## 2022-01-19 NOTE — Patient Instructions (Signed)
You were seen today in the [facility] to establish care and for urinary concerns. Labs were collected, results will be available via MyChart or, if abnormal, you will be contacted by clinic staff. You were prescribed medications, please take as directed. Referral completed to Urology. Please follow up in 3 mths for reevaluation of symptoms. ?

## 2022-01-19 NOTE — Progress Notes (Signed)
? ?[place] ?[address], [place]  [address] ?Phone:  [phone]   Fax:  [phone] ?Subjective:  ? Patient ID: Aaron Greer, male    DOB: 18-Jul-1981, 41 y.o.   MRN: [id number] ? ?Chief Complaint  ?Patient presents with  ? Annual Exam  ?  Pt is here for physical. Pt stated he has urinary retention and back feels itchy   ? ?HPI ?Aaron Greer 41 y.o. male  has a past medical history of Lung nodules, Mediastinal lymphadenopathy, Pneumonia, and Wears glasses. To the [facility] to establish care. Last visit with PCP three years ago.  ? 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 States that he has had urinary frequency and urgency for several years. Urinary stream is sometimes slow to start. Denies any painful urination. Also has had rapid ejaculation during intercourse x 1 yr.  ? ?Also concerned about intermittent increased itching in his back. Has not taken any medications for symptoms. States that itching typically resolves after a few hours without intervention.  ? ?Works as Estate manager/land agent. Currently married with no children. Exercises regularly and eats a balanced diet. Denies any other concerns today.  ? ?Endorses drinking alcohol in large amounts one th weekend. Denies any drug usage or smoking cigarettes.  ? ?Denies any fever. Denies any fatigue, chest pain, shortness of breath, HA or dizziness. Denies any blurred vision, numbness or tingling. ? ? ?Past Medical History:  ?Diagnosis Date  ? Lung nodules   ? RUL  ? Mediastinal lymphadenopathy   ? Pneumonia   ? Wears glasses   ? ? ?Past Surgical History:  ?Procedure Laterality Date  ? IR THORACENTESIS ASP PLEURAL SPACE W/IMG GUIDE  03/18/2019  ? LEG SURGERY    ? VIDEO BRONCHOSCOPY WITH ENDOBRONCHIAL ULTRASOUND N/A 05/21/2019  ? Procedure: VIDEO BRONCHOSCOPY WITH ENDOBRONCHIAL ULTRASOUND;  Surgeon: Collene Gobble, MD;  Location: [facility];  Service: Thoracic;  Laterality: N/A;  ? ? ?History reviewed. No pertinent family  history. ? ?Social History  ? ?Socioeconomic History  ? Marital status: Married  ?  Spouse name: Not on file  ? Number of children: Not on file  ? Years of education: Not on file  ? Highest education level: Not on file  ?Occupational History  ? Not on file  ?Tobacco Use  ? Smoking status: Former  ?  Packs/day: 0.25  ?  Years: 10.00  ?  Pack years: 2.50  ?  Types: Cigarettes  ?  Quit date: 10/22/2008  ?  Years since quitting: 13.2  ? Smokeless tobacco: Never  ?Vaping Use  ? Vaping Use: Never used  ?Substance and Sexual Activity  ? Alcohol use: Yes  ?  Comment: Social  ? Drug use: Never  ? Sexual activity: Yes  ?Other Topics Concern  ? Not on file  ?Social History Narrative  ? Not on file  ? ?Social Determinants of Health  ? ?Financial Resource Strain: Not on file  ?Food Insecurity: Not on file  ?Transportation Needs: Not on file  ?Physical Activity: Not on file  ?Stress: Not on file  ?Social Connections: Not on file  ?Intimate Partner Violence: Not on file  ? ? ?Outpatient Medications Prior to Visit  ?Medication Sig Dispense Refill  ? acetaminophen (TYLENOL) 500 MG tablet Take 1 tablet (500 mg total) by mouth every 6 (six) hours as needed for moderate pain (or Fever >/= 101). (Patient not taking: Reported on 01/19/2022) 20 tablet 0  ? ferrous sulfate 325 (65  FE) MG tablet Take 325 mg by mouth daily after breakfast. (Patient not taking: Reported on 01/19/2022)    ? tamsulosin (FLOMAX) 0.4 MG CAPS capsule TAKE 1 CAPSULE(0.4 MG) BY MOUTH DAILY (Patient not taking: Reported on 01/19/2022) 30 capsule 0  ? ?No facility-administered medications prior to visit.  ? ? ?No Known Allergies ? ?Review of Systems  ?Constitutional:  Negative for chills, fever and malaise/fatigue.  ?HENT: Negative.    ?Eyes: Negative.   ?Respiratory:  Negative for cough and shortness of breath.   ?Cardiovascular:  Negative for chest pain, palpitations and leg swelling.  ?Gastrointestinal:  Negative for abdominal pain, blood in stool, constipation,  diarrhea, nausea and vomiting.  ?Genitourinary:  Positive for frequency and urgency. Negative for dysuria, flank pain and hematuria.  ?     See HPI  ?Musculoskeletal: Negative.   ?Skin:  Positive for itching and rash.  ?Neurological: Negative.   ?Psychiatric/Behavioral:  Negative for depression. The patient is not nervous/anxious.   ?All other systems reviewed and are negative. ? ?   ?Objective:  ?  ?Physical Exam ?Vitals reviewed.  ?Constitutional:   ?   General: He is not in acute distress. ?   Appearance: Normal appearance. He is normal weight.  ?HENT:  ?   Head: Normocephalic.  ?   Right Ear: Tympanic membrane, ear canal and external ear normal. There is no impacted cerumen.  ?   Left Ear: Tympanic membrane, ear canal and external ear normal. There is no impacted cerumen.  ?   Nose: Nose normal. No congestion or rhinorrhea.  ?   Mouth/Throat:  ?   Mouth: Mucous membranes are moist.  ?   Pharynx: Oropharynx is clear. No oropharyngeal exudate or posterior oropharyngeal erythema.  ?Eyes:  ?   General: No scleral icterus.    ?   Right eye: No discharge.     ?   Left eye: No discharge.  ?   Extraocular Movements: Extraocular movements intact.  ?   Conjunctiva/sclera: Conjunctivae normal.  ?   Pupils: Pupils are equal, round, and reactive to light.  ?Neck:  ?   Vascular: No carotid bruit.  ?Cardiovascular:  ?   Rate and Rhythm: Normal rate and regular rhythm.  ?   Pulses: Normal pulses.  ?   Heart sounds: Normal heart sounds.  ?   Comments: No obvious peripheral edema ?Pulmonary:  ?   Effort: Pulmonary effort is normal.  ?   Breath sounds: Normal breath sounds.  ?Abdominal:  ?   General: Abdomen is flat. Bowel sounds are normal. There is no distension.  ?   Palpations: Abdomen is soft. There is no mass.  ?   Tenderness: There is no abdominal tenderness. There is no right CVA tenderness, left CVA tenderness, guarding or rebound.  ?   Hernia: No hernia is present.  ?Genitourinary: ?   Comments: Deferred   ?Musculoskeletal:     ?   General: No swelling, tenderness, deformity or signs of injury. Normal range of motion.  ?   Cervical back: Normal range of motion and neck supple. No rigidity or tenderness.  ?   Right lower leg: No edema.  ?   Left lower leg: No edema.  ?Lymphadenopathy:  ?   Cervical: No cervical adenopathy.  ?Skin: ?   General: Skin is warm and dry.  ?   Capillary Refill: Capillary refill takes less than 2 seconds.  ?   Coloration: Skin is ashen.  ?   Findings: Rash  present. No abrasion, abscess, acne, bruising, burn, ecchymosis, erythema, signs of injury, laceration, lesion, petechiae or wound.  ? ?    ?   Comments: Rash and dark discoloration noted near navel area. Diffuse back, specifically site of increased itching.   ?Neurological:  ?   General: No focal deficit present.  ?   Mental Status: He is alert and oriented to person, place, and time.  ?Psychiatric:     ?   Mood and Affect: Mood normal.     ?   Behavior: Behavior normal.     ?   Thought Content: Thought content normal.     ?   Judgment: Judgment normal.  ? ? ?BP 116/77 (BP Location: Left Arm, Patient Position: Sitting, Cuff Size: Normal)   Pulse 70   Temp 99 ?F (37.2 ?C)   Ht '5\' 8"'$  (1.727 m)   Wt 149 lb 2 oz (67.6 kg)   SpO2 100%   BMI 22.67 kg/m?  ?Wt Readings from Last 3 Encounters:  ?01/19/22 149 lb 2 oz (67.6 kg)  ?09/08/19 140 lb (63.5 kg)  ?08/11/19 138 lb 2 oz (62.7 kg)  ? ? ?Immunization History  ?Administered Date(s) Administered  ? Tdap 03/05/2019  ? ? ?Diabetic Foot Exam - Simple   ?No data filed ?  ? ? ?No results found for: TSH ?Lab Results  ?Component Value Date  ? WBC 6.1 08/11/2019  ? HGB 15.3 08/11/2019  ? HCT 46.3 08/11/2019  ? MCV 92 08/11/2019  ? PLT 246 08/11/2019  ? ?Lab Results  ?Component Value Date  ? NA 137 05/19/2019  ? K 3.7 05/19/2019  ? CO2 24 05/19/2019  ? GLUCOSE 90 05/19/2019  ? BUN 13 05/19/2019  ? CREATININE 0.86 05/19/2019  ? BILITOT 0.9 05/19/2019  ? ALKPHOS 58 05/19/2019  ? AST 22 05/19/2019  ?  ALT 19 05/19/2019  ? PROT 8.0 05/19/2019  ? ALBUMIN 3.5 05/19/2019  ? CALCIUM 9.1 05/19/2019  ? ANIONGAP 9 05/19/2019  ? ?No results found for: CHOL ?No results found for: HDL ?No results found for: [place] ?Lab Results  ?Component V

## 2022-01-20 LAB — LIPID PANEL
Chol/HDL Ratio: 3.3 ratio (ref 0.0–5.0)
Cholesterol, Total: 242 mg/dL — ABNORMAL HIGH (ref 100–199)
HDL: 73 mg/dL (ref >39)
LDL Chol Calc (NIH): 159 mg/dL — ABNORMAL HIGH (ref 0–99)
Triglycerides: 62 mg/dL (ref 0–149)
VLDL Cholesterol Cal: 10 mg/dL (ref 5–40)

## 2022-01-20 LAB — COMPREHENSIVE METABOLIC PANEL
ALT: 23 IU/L (ref 0–44)
AST: 28 IU/L (ref 0–40)
Albumin/Globulin Ratio: 1.9 (ref 1.2–2.2)
Albumin: 4.5 g/dL (ref 4.0–5.0)
Alkaline Phosphatase: 57 IU/L (ref 44–121)
BUN/Creatinine Ratio: 13 (ref 9–20)
BUN: 13 mg/dL (ref 6–20)
Bilirubin Total: 1.5 mg/dL — ABNORMAL HIGH (ref 0.0–1.2)
CO2: 24 mmol/L (ref 20–29)
Calcium: 9.3 mg/dL (ref 8.7–10.2)
Chloride: 103 mmol/L (ref 96–106)
Creatinine, Ser: 0.97 mg/dL (ref 0.76–1.27)
Globulin, Total: 2.4 g/dL (ref 1.5–4.5)
Glucose: 94 mg/dL (ref 70–99)
Potassium: 4.6 mmol/L (ref 3.5–5.2)
Sodium: 142 mmol/L (ref 134–144)
Total Protein: 6.9 g/dL (ref 6.0–8.5)
eGFR: 102 mL/min/{1.73_m2} (ref >59)

## 2022-01-20 LAB — CBC WITH DIFFERENTIAL/PLATELET
Basophils Absolute: 0.1 10*3/uL (ref 0.0–0.2)
Basos: 2 %
EOS (ABSOLUTE): 0.2 10*3/uL (ref 0.0–0.4)
Eos: 4 %
Hematocrit: 49.8 % (ref 37.5–51.0)
Hemoglobin: 15.9 g/dL (ref 13.0–17.7)
Immature Grans (Abs): 0 10*3/uL (ref 0.0–0.1)
Immature Granulocytes: 0 %
Lymphocytes Absolute: 1.4 10*3/uL (ref 0.7–3.1)
Lymphs: 29 %
MCH: 31.3 pg (ref 26.6–33.0)
MCHC: 31.9 g/dL (ref 31.5–35.7)
MCV: 98 fL — ABNORMAL HIGH (ref 79–97)
Monocytes Absolute: 0.4 10*3/uL (ref 0.1–0.9)
Monocytes: 8 %
Neutrophils Absolute: 2.9 10*3/uL (ref 1.4–7.0)
Neutrophils: 57 %
Platelets: 228 10*3/uL (ref 150–450)
RBC: 5.08 x10E6/uL (ref 4.14–5.80)
RDW: 12.3 % (ref 11.6–15.4)
WBC: 5 10*3/uL (ref 3.4–10.8)

## 2022-01-20 LAB — PSA: Prostate Specific Ag, Serum: 1.5 ng/mL (ref 0.0–4.0)

## 2022-01-20 LAB — HEMOGLOBIN A1C
Est. average glucose Bld gHb Est-mCnc: 105 mg/dL
Hgb A1c MFr Bld: 5.3 % (ref 4.8–5.6)

## 2022-01-22 ENCOUNTER — Other Ambulatory Visit: Payer: Self-pay | Admitting: Nurse Practitioner

## 2022-01-22 DIAGNOSIS — E7849 Other hyperlipidemia: Secondary | ICD-10-CM

## 2022-01-22 MED ORDER — ATORVASTATIN CALCIUM 40 MG PO TABS: 40.0000 mg | ORAL_TABLET | Freq: Every day | ORAL | 3 refills | Status: DC

## 2022-02-20 ENCOUNTER — Telehealth: Payer: Self-pay

## 2022-02-21 ENCOUNTER — Other Ambulatory Visit: Payer: Self-pay

## 2022-02-21 ENCOUNTER — Other Ambulatory Visit: Payer: Self-pay | Admitting: Nurse Practitioner

## 2022-02-21 ENCOUNTER — Telehealth: Payer: Self-pay

## 2022-02-21 DIAGNOSIS — R339 Retention of urine, unspecified: Secondary | ICD-10-CM

## 2022-02-26 NOTE — Telephone Encounter (Signed)
No additional notes needed  

## 2022-02-27 ENCOUNTER — Other Ambulatory Visit: Payer: Self-pay

## 2022-02-27 DIAGNOSIS — L853 Xerosis cutis: Secondary | ICD-10-CM

## 2022-02-27 DIAGNOSIS — L2089 Other atopic dermatitis: Secondary | ICD-10-CM

## 2022-02-27 MED ORDER — TRIAMCINOLONE ACETONIDE 0.1 % EX CREA: 1.0000 "application " | TOPICAL_CREAM | Freq: Two times a day (BID) | CUTANEOUS | 0 refills | Status: DC

## 2022-03-02 NOTE — Telephone Encounter (Signed)
No additional notes needed  

## 2022-03-12 ENCOUNTER — Other Ambulatory Visit: Payer: Self-pay

## 2022-03-12 DIAGNOSIS — L853 Xerosis cutis: Secondary | ICD-10-CM

## 2022-03-12 DIAGNOSIS — L2089 Other atopic dermatitis: Secondary | ICD-10-CM

## 2022-03-12 DIAGNOSIS — E7849 Other hyperlipidemia: Secondary | ICD-10-CM

## 2022-03-13 MED ORDER — ATORVASTATIN CALCIUM 40 MG PO TABS: 40.0000 mg | ORAL_TABLET | Freq: Every day | ORAL | 3 refills | Status: DC

## 2022-03-13 MED ORDER — TRIAMCINOLONE ACETONIDE 0.1 % EX CREA: 1.0000 "application " | TOPICAL_CREAM | Freq: Two times a day (BID) | CUTANEOUS | 0 refills | Status: DC

## 2022-04-27 ENCOUNTER — Ambulatory Visit: Payer: PRIVATE HEALTH INSURANCE | Admitting: Nurse Practitioner

## 2022-06-01 ENCOUNTER — Encounter: Payer: Self-pay | Admitting: Nurse Practitioner

## 2022-06-01 ENCOUNTER — Other Ambulatory Visit: Payer: Self-pay

## 2022-06-01 ENCOUNTER — Ambulatory Visit (INDEPENDENT_AMBULATORY_CARE_PROVIDER_SITE_OTHER): Payer: Managed Care, Other (non HMO) | Admitting: Nurse Practitioner

## 2022-06-01 VITALS — BP 120/79 | HR 69 | Temp 98.0°F | Ht 68.0 in | Wt 144.0 lb

## 2022-06-01 DIAGNOSIS — R339 Retention of urine, unspecified: Secondary | ICD-10-CM | POA: Diagnosis not present

## 2022-06-01 DIAGNOSIS — J479 Bronchiectasis, uncomplicated: Secondary | ICD-10-CM

## 2022-06-01 DIAGNOSIS — R053 Chronic cough: Secondary | ICD-10-CM | POA: Diagnosis not present

## 2022-06-01 DIAGNOSIS — E7849 Other hyperlipidemia: Secondary | ICD-10-CM | POA: Diagnosis not present

## 2022-06-01 MED ORDER — BENZONATATE 100 MG PO CAPS
100.0000 mg | ORAL_CAPSULE | Freq: Two times a day (BID) | ORAL | 0 refills | Status: DC | PRN
Start: 2022-06-01 — End: 2022-11-23
  Filled 2022-06-01 – 2022-11-10 (×2): qty 20, 10d supply, fill #0

## 2022-06-01 MED ORDER — TAMSULOSIN HCL 0.4 MG PO CAPS
0.4000 mg | ORAL_CAPSULE | Freq: Every day | ORAL | 0 refills | Status: DC
Start: 2022-06-01 — End: 2022-12-28
  Filled 2022-06-01 – 2022-07-31 (×2): qty 30, 30d supply, fill #0
  Filled 2022-08-10: qty 90, 90d supply, fill #0

## 2022-06-01 MED ORDER — ATORVASTATIN CALCIUM 40 MG PO TABS
40.0000 mg | ORAL_TABLET | Freq: Every day | ORAL | 3 refills | Status: DC
  Filled 2022-06-01: qty 30, 30d supply, fill #0
  Filled 2022-06-01 – 2022-08-10 (×2): qty 90, 90d supply, fill #0

## 2022-06-01 NOTE — Progress Notes (Signed)
$'@Patient'n$  ID: Aaron Greer, male    DOB: Mar 07, 1981, 41 y.o.   MRN: [id number]  Chief Complaint  Patient presents with   Follow-up    Pt is here for 3 month follow up visit. Pt states he had a dry cough for 5 day's. Pt is requsting medication for cough and refill on tamsulosin,atorvastatin     Referring provider: Dorena Dew, FNP   HPI  Aaron Greer 41 y.o. male  has a past medical history of Lung nodules, Mediastinal lymphadenopathy, Pneumonia, and Wears glasses.  Patient presents today for a follow up visit. He was started on flomax at last visit for urinary retention and states that this has been working well. Does have history of bronchiectasis and chronic cough. He does need a follow up with pulmonary - will place another referral. Denies f/c/s, n/v/d, hemoptysis, PND, leg swelling Denies chest pain or edema    No Known Allergies  Immunization History  Administered Date(s) Administered   Tdap 03/05/2019    Past Medical History:  Diagnosis Date   Lung nodules    RUL   Mediastinal lymphadenopathy    Pneumonia    Wears glasses     Tobacco History: Social History   Tobacco Use  Smoking Status Former   Packs/day: 0.25   Years: 10.00   Total pack years: 2.50   Types: Cigarettes   Quit date: 10/22/2008   Years since quitting: 13.6  Smokeless Tobacco Never   Counseling given: Not Answered   Outpatient Encounter Medications as of 06/01/2022  Medication Sig   atorvastatin (LIPITOR) 40 MG tablet Take 1 tablet (40 mg total) by mouth daily.   tamsulosin (FLOMAX) 0.4 MG CAPS capsule TAKE 1 CAPSULE(0.4 MG) BY MOUTH DAILY   triamcinolone cream (KENALOG) 0.1 % Apply 1 application. topically 2 (two) times daily.   acetaminophen (TYLENOL) 500 MG tablet Take 1 tablet (500 mg total) by mouth every 6 (six) hours as needed for moderate pain (or Fever >/= 101). (Patient not taking: Reported on 01/19/2022)   ferrous sulfate 325 (65 FE) MG tablet Take 325 mg by mouth daily  after breakfast. (Patient not taking: Reported on 01/19/2022)   No facility-administered encounter medications on file as of 06/01/2022.     Review of Systems  Review of Systems  Constitutional: Negative.   HENT: Negative.    Respiratory:  Positive for cough.   Cardiovascular: Negative.   Gastrointestinal: Negative.   Allergic/Immunologic: Negative.   Neurological: Negative.   Psychiatric/Behavioral: Negative.         Physical Exam  BP 120/79 (BP Location: Right Arm, Patient Position: Sitting, Cuff Size: Normal)   Pulse 69   Temp 98 F (36.7 C)   Ht '5\' 8"'$  (1.727 m)   Wt 144 lb (65.3 kg)   SpO2 100%   BMI 21.90 kg/m   Wt Readings from Last 5 Encounters:  06/01/22 144 lb (65.3 kg)  01/19/22 149 lb 2 oz (67.6 kg)  09/08/19 140 lb (63.5 kg)  08/11/19 138 lb 2 oz (62.7 kg)  06/16/19 135 lb (61.2 kg)     Physical Exam Vitals and nursing note reviewed.  Constitutional:      General: He is not in acute distress.    Appearance: He is well-developed.  Cardiovascular:     Rate and Rhythm: Normal rate and regular rhythm.  Pulmonary:     Effort: Pulmonary effort is normal.     Breath sounds: Normal breath sounds.  Skin:    General: Skin is  warm and dry.  Neurological:     Mental Status: He is alert and oriented to person, place, and time.      Lab Results:  CBC    Component Value Date/Time   WBC 5.0 01/19/2022 0847   WBC 5.6 05/19/2019 0948   RBC 5.08 01/19/2022 0847   RBC 4.64 05/19/2019 0948   HGB 15.9 01/19/2022 0847   HCT 49.8 01/19/2022 0847   PLT 228 01/19/2022 0847   MCV 98 (H) 01/19/2022 0847   MCH 31.3 01/19/2022 0847   MCH 30.0 05/19/2019 0948   MCHC 31.9 01/19/2022 0847   MCHC 30.7 05/19/2019 0948   RDW 12.3 01/19/2022 0847   LYMPHSABS 1.4 01/19/2022 0847   MONOABS 0.9 02/16/2019 0507   EOSABS 0.2 01/19/2022 0847   BASOSABS 0.1 01/19/2022 0847    BMET    Component Value Date/Time   NA 142 01/19/2022 0847   K 4.6 01/19/2022 0847   CL  103 01/19/2022 0847   CO2 24 01/19/2022 0847   GLUCOSE 94 01/19/2022 0847   GLUCOSE 90 05/19/2019 0948   BUN 13 01/19/2022 0847   CREATININE 0.97 01/19/2022 0847   CALCIUM 9.3 01/19/2022 0847   GFRNONAA >60 05/19/2019 0948   GFRAA >60 05/19/2019 0948    BNP    Component Value Date/Time   BNP 30.8 02/14/2019 0135    ProBNP No results found for: "PROBNP"  Imaging: No results found.   Assessment & Plan:   No problem-specific Assessment & Plan notes found for this encounter.     Fenton Foy, NP 06/01/2022

## 2022-06-01 NOTE — Assessment & Plan Note (Signed)
-   tamsulosin (FLOMAX) 0.4 MG CAPS capsule; TAKE 1 CAPSULE(0.4 MG) BY MOUTH DAILY  Dispense: 90 capsule; Refill: 0  2. Other hyperlipidemia  - atorvastatin (LIPITOR) 40 MG tablet; Take 1 tablet (40 mg total) by mouth daily.  Dispense: 90 tablet; Refill: 3  3. Bronchiectasis without complication ([place])  - Ambulatory referral to Pulmonology  4. Chronic cough  - Ambulatory referral to Pulmonology    Follow up:  Follow up in 6 months or sooner if needed

## 2022-06-01 NOTE — Patient Instructions (Addendum)
1. Urinary retention  - tamsulosin (FLOMAX) 0.4 MG CAPS capsule; TAKE 1 CAPSULE(0.4 MG) BY MOUTH DAILY  Dispense: 90 capsule; Refill: 0  2. Other hyperlipidemia  - atorvastatin (LIPITOR) 40 MG tablet; Take 1 tablet (40 mg total) by mouth daily.  Dispense: 90 tablet; Refill: 3  3. Bronchiectasis without complication ([place])  - Ambulatory referral to Pulmonology  4. Chronic cough  - Ambulatory referral to Pulmonology    Follow up:  Follow up in 6 months or sooner if needed

## 2022-06-07 ENCOUNTER — Other Ambulatory Visit: Payer: Self-pay

## 2022-08-01 ENCOUNTER — Other Ambulatory Visit: Payer: Self-pay

## 2022-08-01 ENCOUNTER — Other Ambulatory Visit: Payer: Self-pay | Admitting: Nurse Practitioner

## 2022-08-01 MED ORDER — FERROUS SULFATE 325 (65 FE) MG PO TABS: 325.0000 mg | ORAL_TABLET | Freq: Every day | ORAL | 2 refills | Status: AC

## 2022-08-08 ENCOUNTER — Other Ambulatory Visit: Payer: Self-pay

## 2022-08-10 ENCOUNTER — Other Ambulatory Visit (HOSPITAL_COMMUNITY): Payer: Self-pay

## 2022-08-15 ENCOUNTER — Other Ambulatory Visit (HOSPITAL_COMMUNITY): Payer: Self-pay

## 2022-08-15 ENCOUNTER — Encounter (HOSPITAL_COMMUNITY): Payer: Self-pay | Admitting: Pharmacist

## 2022-08-17 ENCOUNTER — Other Ambulatory Visit (HOSPITAL_COMMUNITY): Payer: Self-pay

## 2022-08-28 ENCOUNTER — Ambulatory Visit (INDEPENDENT_AMBULATORY_CARE_PROVIDER_SITE_OTHER): Payer: Managed Care, Other (non HMO) | Admitting: Pulmonary Disease

## 2022-08-28 ENCOUNTER — Encounter: Payer: Self-pay | Admitting: Pulmonary Disease

## 2022-08-28 ENCOUNTER — Other Ambulatory Visit: Payer: Self-pay

## 2022-08-28 VITALS — BP 118/72 | HR 60 | Ht 68.0 in | Wt 150.8 lb

## 2022-08-28 DIAGNOSIS — J8410 Pulmonary fibrosis, unspecified: Secondary | ICD-10-CM | POA: Diagnosis not present

## 2022-08-28 DIAGNOSIS — J208 Acute bronchitis due to other specified organisms: Secondary | ICD-10-CM | POA: Diagnosis not present

## 2022-08-28 DIAGNOSIS — J189 Pneumonia, unspecified organism: Secondary | ICD-10-CM

## 2022-08-28 MED ORDER — DOXYCYCLINE HYCLATE 100 MG PO TABS
100.0000 mg | ORAL_TABLET | Freq: Two times a day (BID) | ORAL | 0 refills | Status: DC
  Filled 2022-08-28: qty 14, 7d supply, fill #0

## 2022-08-28 NOTE — Patient Instructions (Signed)
Acute bronchitis in context of chronic granulomatous lung disease: Please provide [place] with 3 samples of mucus We will test the mucus for bacterial, fungal, AFB cultures High-resolution CT scan of the chest After you have provided [place] with those samples of mucus take doxycycline 100 mg p.o. twice daily x5 days Drink plenty of fluids Call me if symptoms worsen or do not improve  Follow-up with [place] in 6 to 8 weeks to go over the results of the culture and CT scan.

## 2022-08-28 NOTE — Progress Notes (Signed)
Synopsis: First seen in 2020 by [facility], Dr. Lamonte Sakai.  Dr. Lamonte Sakai summarized his situation as follows:  ROV 06/16/2019 -41 year old man who had a right exudative and lymphocyte predominant pleural effusion with 2 thoracenteses, slightly elevated ACE level, borderline positive ANCA, ANA.  Positive QuantiFERON without a known exposure.  He also had persistent mediastinal lymphadenopathy, hilar adenopathy on follow-up CT chest with some patchy tree-in-bud opacity in the right upper lobe (stable) etiology unclear.  He underwent bronchoscopy with endobronchial ultrasound on 05/21/2019.  All cytologies were normal including nodal biopsy at station 7 and 10 R.  Fungal, bacterial cultures are final and negative.  AFB negative so far. No cough, good exercise tolerance. He had an isolated episode R sided pain - no recurrence.     Subjective:   PATIENT ID: Aaron Greer GENDER: male DOB: [birth year], MRN: [id number]   HPI  Chief Complaint  Patient presents with   Consult    Cough and itching right side of ribs.    Here to see me for coughing up mucus > yellow mucus > sometimes bloody > coughs up all day > bloody mucus is rare > he's had fever recently as recent as a few days ago > no weight loss > no night sweats  No dyspnea  Was doing OK, but he got worse about a week ago.   In the last three years he has not needed to be treated for a respiratory infection.  He says that he has been taking advil for fever.  He says that his sinuses were OK until a few a days ago when it got cold  No smoking  No heartburn, no indigestion  Works for [employer].  Living environment has been stable.   Record review: Patient was first seen in 2020 by [facility].  Was hospitalized, had a right pleural effusion and thoracentesis was performed which showed lymphocytes, no malignant cells on cytology.  He ended up having a bronchoscopy with EBUS in July 2020 where station 7 and station  10 lymph nodes were biopsied, cytology negative, no special stains reported, BAL from the right lung collected in 3 separate samples sent, none were positive for AFB or other organisms  Past Medical History:  Diagnosis Date   Lung nodules    RUL   Mediastinal lymphadenopathy    Pneumonia    Wears glasses      History reviewed. No pertinent family history.   Social History   Socioeconomic History   Marital status: Married    Spouse name: Not on file   Number of children: Not on file   Years of education: Not on file   Highest education level: Not on file  Occupational History   Not on file  Tobacco Use   Smoking status: Former    Packs/day: 0.25    Years: 10.00    Total pack years: 2.50    Types: Cigarettes    Quit date: 10/22/2008    Years since quitting: 13.8   Smokeless tobacco: Never  Vaping Use   Vaping Use: Never used  Substance and Sexual Activity   Alcohol use: Yes    Comment: Social   Drug use: Never   Sexual activity: Yes  Other Topics Concern   Not on file  Social History Narrative   Not on file   Social Determinants of Health   Financial Resource Strain: Not on file  Food Insecurity: Not on file  Transportation Needs: Not on file  Physical Activity:  Not on file  Stress: Not on file  Social Connections: Not on file  Intimate Partner Violence: Not on file     No Known Allergies   Outpatient Medications Prior to Visit  Medication Sig Dispense Refill   benzonatate (TESSALON) 100 MG capsule Take 1 capsule (100 mg total) by mouth 2 (two) times daily as needed for cough. 20 capsule 0   acetaminophen (TYLENOL) 500 MG tablet Take 1 tablet (500 mg total) by mouth every 6 (six) hours as needed for moderate pain (or Fever >/= 101). (Patient not taking: Reported on 01/19/2022) 20 tablet 0   atorvastatin (LIPITOR) 40 MG tablet Take 1 tablet (40 mg total) by mouth daily. 90 tablet 3   ferrous sulfate 325 (65 FE) MG tablet Take 1 tablet (325 mg total) by mouth  daily after breakfast. 90 tablet 2   tamsulosin (FLOMAX) 0.4 MG CAPS capsule Take 1 capsule (0.4 mg total) by mouth daily. 90 capsule 0   triamcinolone cream (KENALOG) 0.1 % Apply 1 application. topically 2 (two) times daily. 30 g 0   No facility-administered medications prior to visit.    Review of Systems  Constitutional:  Positive for fever. Negative for chills, malaise/fatigue and weight loss.  HENT:  Negative for congestion, nosebleeds, sinus pain and sore throat.   Eyes:  Negative for photophobia, pain and discharge.  Respiratory:  Positive for cough and sputum production. Negative for hemoptysis, shortness of breath and wheezing.   Cardiovascular:  Negative for chest pain, palpitations, orthopnea and leg swelling.  Gastrointestinal:  Negative for abdominal pain, constipation, diarrhea, nausea and vomiting.  Genitourinary:  Negative for dysuria, frequency, hematuria and urgency.  Musculoskeletal:  Negative for back pain, joint pain, myalgias and neck pain.  Skin:  Negative for itching and rash.  Neurological:  Negative for tingling, tremors, sensory change, speech change, focal weakness, seizures, weakness and headaches.  Psychiatric/Behavioral:  Negative for memory loss, substance abuse and suicidal ideas. The patient is not nervous/anxious.       Objective:  Physical Exam   Vitals:   08/28/22 0953  BP: 118/72  Pulse: 60  SpO2: 98%  Weight: 150 lb 12.8 oz (68.4 kg)  Height: _0  (1.727 m)    Gen: well appearing, no acute distress HENT: NCAT, OP clear, neck supple without masses Eyes: PERRL, EOMi Lymph: no cervical lymphadenopathy PULM: Diminished R base, clear on left CV: RRR, no mgr, no JVD GI: BS+, soft, nontender, no hsm Derm: no rash or skin breakdown MSK: normal bulk and tone Neuro: A&Ox4, CN II-XII intact, strength 5/5 in all 4 extremities Psyche: normal mood and affect   CBC    Component Value Date/Time   WBC 5.0 01/19/2022 0847   WBC 5.6 05/19/2019  0948   RBC 5.08 01/19/2022 0847   RBC 4.64 05/19/2019 0948   HGB 15.9 01/19/2022 0847   HCT 49.8 01/19/2022 0847   PLT 228 01/19/2022 0847   MCV 98 (H) 01/19/2022 0847   MCH 31.3 01/19/2022 0847   MCH 30.0 05/19/2019 0948   MCHC 31.9 01/19/2022 0847   MCHC 30.7 05/19/2019 0948   RDW 12.3 01/19/2022 0847   LYMPHSABS 1.4 01/19/2022 0847   MONOABS 0.9 02/16/2019 0507   EOSABS 0.2 01/19/2022 0847   BASOSABS 0.1 01/19/2022 0847     Chest imaging: 2021 CT chest images independently reviewed showing significant pleural thickening and loculated pleural effusion on the right, tree-in-bud abnormalities in the right upper lobe apex.  Station 7 lymph node enlargement  noted  PFT:  Labs: April 2020 AFB sputum culture negative April 2020 AFB pleural fluid culture negative, fungal culture negative, bacterial culture negative July 2020 BAL x3, all sent for bacterial, fungal, AFB >> all studies negative  Path: April 2020 right pleural fluid cytology negative, lymphocytes present July 2020 cytology station 7, 10 lymph nodes: No malignant cells, no granulomas reported July 2020 cytology from 3 separate BALs: All 3 negative  Echo:  Heart Catheterization:       Assessment & Plan:   Lung infection - Plan: CT Chest High Resolution, AFB Culture & Smear, Respiratory or Resp and Sputum Culture, Fungus Culture with Smear, doxycycline (VIBRA-TABS) 100 MG tablet  Granulomatous lung disease ([place])  Acute bronchitis due to other specified organisms  Discussion: He returns to clinic today for the first time in 3 years after an extensive work-up for presumed granulomatous disease back in 2020.  At that time he had an extensive work-up which did not reveal a diagnosis.  His imaging abnormalities back in 2020 (pleural thickening, tree-in-bud abnormalities are highly suggestive of a granulomatous disease, most likely some form of underlying chronic infection.  Now he returns with bronchitis and  sinusitis symptoms.  I am worried that he could have recurrence of what ever underlying chronic infection may be going on.  No evidence of TB based on lack of significant B symptoms.  Plan: Acute bronchitis in context of chronic granulomatous lung disease: Please provide [place] with 3 samples of mucus We will test the mucus for bacterial, fungal, AFB cultures High-resolution CT scan of the chest After you have provided [place] with those samples of mucus take doxycycline 100 mg p.o. twice daily x5 days Drink plenty of fluids Call me if symptoms worsen or do not improve  Follow-up with [place] in 6 to 8 weeks to go over the results of the culture and CT scan.   Immunizations: Immunization History  Administered Date(s) Administered   Tdap 03/05/2019     Current Outpatient Medications:    benzonatate (TESSALON) 100 MG capsule, Take 1 capsule (100 mg total) by mouth 2 (two) times daily as needed for cough., Disp: 20 capsule, Rfl: 0   acetaminophen (TYLENOL) 500 MG tablet, Take 1 tablet (500 mg total) by mouth every 6 (six) hours as needed for moderate pain (or Fever >/= 101). (Patient not taking: Reported on 01/19/2022), Disp: 20 tablet, Rfl: 0   atorvastatin (LIPITOR) 40 MG tablet, Take 1 tablet (40 mg total) by mouth daily., Disp: 90 tablet, Rfl: 3   doxycycline (VIBRA-TABS) 100 MG tablet, Take 1 tablet (100 mg total) by mouth 2 (two) times daily., Disp: 14 tablet, Rfl: 0   ferrous sulfate 325 (65 FE) MG tablet, Take 1 tablet (325 mg total) by mouth daily after breakfast., Disp: 90 tablet, Rfl: 2   tamsulosin (FLOMAX) 0.4 MG CAPS capsule, Take 1 capsule (0.4 mg total) by mouth daily., Disp: 90 capsule, Rfl: 0   triamcinolone cream (KENALOG) 0.1 %, Apply 1 application. topically 2 (two) times daily., Disp: 30 g, Rfl: 0

## 2022-08-29 ENCOUNTER — Other Ambulatory Visit: Payer: Self-pay

## 2022-08-29 DIAGNOSIS — E7849 Other hyperlipidemia: Secondary | ICD-10-CM

## 2022-08-29 MED ORDER — ATORVASTATIN CALCIUM 40 MG PO TABS
40.0000 mg | ORAL_TABLET | Freq: Every day | ORAL | 1 refills | Status: DC
  Filled 2022-08-29 – 2022-12-28 (×2): qty 90, 90d supply, fill #0

## 2022-08-30 ENCOUNTER — Other Ambulatory Visit: Payer: Self-pay | Admitting: Nurse Practitioner

## 2022-08-30 ENCOUNTER — Other Ambulatory Visit: Payer: PRIVATE HEALTH INSURANCE

## 2022-08-30 DIAGNOSIS — J189 Pneumonia, unspecified organism: Secondary | ICD-10-CM

## 2022-08-31 ENCOUNTER — Other Ambulatory Visit: Payer: Self-pay

## 2022-09-05 ENCOUNTER — Ambulatory Visit (HOSPITAL_BASED_OUTPATIENT_CLINIC_OR_DEPARTMENT_OTHER): Admission: RE | Admit: 2022-09-05 | Payer: PRIVATE HEALTH INSURANCE | Source: Ambulatory Visit

## 2022-09-25 ENCOUNTER — Telehealth: Payer: Self-pay

## 2022-09-25 NOTE — Telephone Encounter (Signed)
Called pt to find out if he need refills for atorvastatin and [facility] or to keep them at the current pharmacy

## 2022-10-01 LAB — FUNGUS CULTURE W SMEAR
CULTURE:: NO GROWTH
MICRO NUMBER:: 14167386
SMEAR:: NONE SEEN
SPECIMEN QUALITY:: ADEQUATE

## 2022-10-23 ENCOUNTER — Ambulatory Visit (HOSPITAL_BASED_OUTPATIENT_CLINIC_OR_DEPARTMENT_OTHER)
Admission: RE | Admit: 2022-10-23 | Discharge: 2022-10-23 | Disposition: A | Discharge status: Home / Self Care | Payer: Managed Care, Other (non HMO) | Source: Ambulatory Visit | Attending: Pulmonary Disease | Admitting: Pulmonary Disease

## 2022-10-23 DIAGNOSIS — J189 Pneumonia, unspecified organism: Secondary | ICD-10-CM | POA: Insufficient documentation

## 2022-10-25 ENCOUNTER — Encounter: Payer: Self-pay | Admitting: Pulmonary Disease

## 2022-10-29 LAB — AFB CULTURE WITH SMEAR (NOT AT ARMC)
Acid Fast Culture: NEGATIVE
Acid Fast Smear: NEGATIVE

## 2022-11-12 ENCOUNTER — Other Ambulatory Visit: Payer: Self-pay

## 2022-11-23 ENCOUNTER — Ambulatory Visit: Payer: Managed Care, Other (non HMO) | Admitting: Pulmonary Disease

## 2022-11-23 ENCOUNTER — Encounter: Payer: Self-pay | Admitting: Pulmonary Disease

## 2022-11-23 VITALS — BP 114/72 | HR 65 | Ht 68.0 in | Wt 146.6 lb

## 2022-11-23 DIAGNOSIS — J479 Bronchiectasis, uncomplicated: Secondary | ICD-10-CM

## 2022-11-23 NOTE — Progress Notes (Signed)
Synopsis: First seen in 2020 by [facility], Dr. Lamonte Sakai.  Dr. Lamonte Sakai summarized his situation as follows:  ROV 06/16/2019 -42 year old man who had a right exudative and lymphocyte predominant pleural effusion with 2 thoracenteses, slightly elevated ACE level, borderline positive ANCA, ANA.  Positive QuantiFERON without a known exposure.  He also had persistent mediastinal lymphadenopathy, hilar adenopathy on follow-up CT chest with some patchy tree-in-bud opacity in the right upper lobe (stable) etiology unclear.  He underwent bronchoscopy with endobronchial ultrasound on 05/21/2019.  All cytologies were normal including nodal biopsy at station 7 and 10 R.  Fungal, bacterial cultures are final and negative.  AFB negative so far. No cough, good exercise tolerance. He had an isolated episode R sided pain - no recurrence.     Subjective:   PATIENT ID: Aaron Greer GENDER: male DOB: Apr 17, 1981, MRN: [id number]   HPI  Chief Complaint  Patient presents with   Follow-up    F/U for cough. States he has been doing well since last visit. Still has a productive cough with clear phlegm. Denies any signs of infection.     He has been doing better No recent infections like bronchitis or pneumonia He continues to produce a small amount of clear mucus, not much No recent fevery  CT is better, fungal culture better, consider immunology work up   Past Medical History:  Diagnosis Date   Lung nodules    RUL   Mediastinal lymphadenopathy    Pneumonia    Wears glasses         Objective:  Physical Exam   Vitals:   11/23/22 0847  BP: 114/72  Pulse: 65  SpO2: 100%  Weight: 146 lb 9.6 oz (66.5 kg)  Height: '5\' 8"'$  (1.727 m)    Gen: well appearing HENT: OP clear, neck supple PULM: CTA B, normal effort  CV: RRR, no mgr GI: BS+, soft, nontender Derm: no cyanosis or rash Psyche: normal mood and affect    CBC    Component Value Date/Time   WBC 5.0 01/19/2022 0847   WBC 5.6  05/19/2019 0948   RBC 5.08 01/19/2022 0847   RBC 4.64 05/19/2019 0948   HGB 15.9 01/19/2022 0847   HCT 49.8 01/19/2022 0847   PLT 228 01/19/2022 0847   MCV 98 (H) 01/19/2022 0847   MCH 31.3 01/19/2022 0847   MCH 30.0 05/19/2019 0948   MCHC 31.9 01/19/2022 0847   MCHC 30.7 05/19/2019 0948   RDW 12.3 01/19/2022 0847   LYMPHSABS 1.4 01/19/2022 0847   MONOABS 0.9 02/16/2019 0507   EOSABS 0.2 01/19/2022 0847   BASOSABS 0.1 01/19/2022 0847     Chest imaging: 2021 CT chest images independently reviewed showing significant pleural thickening and loculated pleural effusion on the right, tree-in-bud abnormalities in the right upper lobe apex.  Station 7 lymph node enlargement noted 10/2022 HRCT> personally reviewed> pleural thickening apices, chronc pleural effusion R base has improved, nodularity in R apex > L remains, adenopathy seen on prior images resolved  PFT:  Labs: April 2020 AFB sputum culture negative April 2020 AFB pleural fluid culture negative, fungal culture negative, bacterial culture negative July 2020 BAL x3, all sent for bacterial, fungal, AFB >> all studies negative 09/2022 Sputum bacterial > neg 09/2022 Sputum fungal > neg 09/2022 Sputum  AFB > neg  Path: April 2020 right pleural fluid cytology negative, lymphocytes present July 2020 cytology station 7, 10 lymph nodes: No malignant cells, no granulomas reported July 2020 cytology from 3 separate BALs:  All 3 negative  Echo:  Heart Catheterization:       Assessment & Plan:   Bronchiectasis without complication [facility])  Discussion: This has been a stable interval for him.  Respiratory cultures were negative and the CT scan shows notable improvement compared to his initial presentation in 2020.  I explained to him today that he does have some mild scarring in his lung which will make him more susceptible to respiratory infections in the future but right now because he is doing so well I do not anticipate there  being a need to change or add any sort of treatment.  From my standpoint he can follow-up with [place] on an as-needed basis.  Plan: Bronchiectasis, mild: The key to bronchiectasis management is taking a balanced approach to your health: exercise, nutrition, mucociliary clearance, and attention to airway infection.  Exercise: Stay physically active: exercise multiple days per week with endurance and resistance activities  Nutrition: Drink at least 64oz of water a day unless directed otherwise by a physician Your diet should be well balanced with a heavy focus on unprocessed fruits and vegetables Make it a goal to eat 1.2g/kg body weight of lean protein a day.  For example, if you weigh 50 kg then try to eat 60 grams of fish, egg white protein, lentils in a day  Mucociliary clearance: This term refers to the physical effort you make routinely to help clear mucus out of your lungs As a reminder, most patients with bronchiectasis produce mucus every day  Attention to airway infection: If you experience increasing chest congestion, mucus production, change in mucus color, shortness of breath, or fever I need to know right away  Follow up with [place] if symptoms return or worsen  Immunizations: Immunization History  Administered Date(s) Administered   Influenza-Unspecified 08/05/2022   Tdap 03/05/2019     Current Outpatient Medications:    atorvastatin (LIPITOR) 40 MG tablet, Take 1 tablet (40 mg total) by mouth daily., Disp: 90 tablet, Rfl: 1   ferrous sulfate 325 (65 FE) MG tablet, Take 1 tablet (325 mg total) by mouth daily after breakfast., Disp: 90 tablet, Rfl: 2   tamsulosin (FLOMAX) 0.4 MG CAPS capsule, Take 1 capsule (0.4 mg total) by mouth daily., Disp: 90 capsule, Rfl: 0

## 2022-11-23 NOTE — Patient Instructions (Signed)
Bronchiectasis, mild: The key to bronchiectasis management is taking a balanced approach to your health: exercise, nutrition, mucociliary clearance, and attention to airway infection.  Exercise: Stay physically active: exercise multiple days per week with endurance and resistance activities  Nutrition: Drink at least 64oz of water a day unless directed otherwise by a physician Your diet should be well balanced with a heavy focus on unprocessed fruits and vegetables Make it a goal to eat 1.2g/kg body weight of lean protein a day.  For example, if you weigh 50 kg then try to eat 60 grams of fish, egg white protein, lentils in a day  Mucociliary clearance: This term refers to the physical effort you make routinely to help clear mucus out of your lungs As a reminder, most patients with bronchiectasis produce mucus every day  Attention to airway infection: If you experience increasing chest congestion, mucus production, change in mucus color, shortness of breath, or fever I need to know right away  Follow up with [place] if symptoms return or worsen

## 2022-12-01 ENCOUNTER — Ambulatory Visit
Admission: EM | Admit: 2022-12-01 | Discharge: 2022-12-01 | Disposition: A | Discharge status: Home / Self Care | Payer: Managed Care, Other (non HMO) | Attending: Internal Medicine | Admitting: Internal Medicine

## 2022-12-01 DIAGNOSIS — R110 Nausea: Secondary | ICD-10-CM

## 2022-12-01 DIAGNOSIS — R197 Diarrhea, unspecified: Secondary | ICD-10-CM

## 2022-12-01 DIAGNOSIS — A084 Viral intestinal infection, unspecified: Secondary | ICD-10-CM | POA: Diagnosis not present

## 2022-12-01 MED ORDER — ONDANSETRON 4 MG PO TBDP: 4.0000 mg | ORAL_TABLET | Freq: Three times a day (TID) | ORAL | 0 refills | Status: AC | PRN

## 2022-12-01 NOTE — ED Provider Notes (Signed)
[facility]    CSN: [id number] Arrival date & time: 12/01/22  [id number]      History   Chief Complaint Chief Complaint  Patient presents with   Diarrhea    Entered by patient    HPI Aaron Greer is a 42 y.o. male.   Patient presents with nausea, diarrhea, fatigue that started about 5 days ago.  Patient denies any associated vomiting or blood in stool.  Denies abdominal pain.  Denies any known sick contacts.  Denies any associated upper respiratory symptoms, cough, fever.  Patient has been able to tolerate food and fluids.  Denies any associated abdominal pain.  Denies any recent unfavorable foods or travel outside the [place].  Denies dizziness or lightheadedness.   Diarrhea   Past Medical History:  Diagnosis Date   Lung nodules    RUL   Mediastinal lymphadenopathy    Pneumonia    Wears glasses     Patient Active Problem List   Diagnosis Date Noted   Urinary retention 06/01/2022   Bronchiectasis without complication ([place])    Mediastinal lymphadenopathy 04/20/2019   Normocytic anemia 02/14/2019   Thrombocytosis 02/14/2019   Elevated alkaline phosphatase level 02/14/2019   Hypokalemia 02/14/2019   Pneumonia 02/14/2019   Pleural effusion on right 02/13/2019    Past Surgical History:  Procedure Laterality Date   IR THORACENTESIS ASP PLEURAL SPACE W/IMG GUIDE  03/18/2019   LEG SURGERY     VIDEO BRONCHOSCOPY WITH ENDOBRONCHIAL ULTRASOUND N/A 05/21/2019   Procedure: VIDEO BRONCHOSCOPY WITH ENDOBRONCHIAL ULTRASOUND;  Surgeon: Collene Gobble, MD;  Location: [facility] OR;  Service: Thoracic;  Laterality: N/A;       Home Medications    Prior to Admission medications   Medication Sig Start Date End Date Taking? Authorizing Provider  ondansetron (ZOFRAN-ODT) 4 MG disintegrating tablet Take 1 tablet (4 mg total) by mouth every 8 (eight) hours as needed for nausea or vomiting. 12/01/22  Yes Oswaldo Conroy E, FNP  atorvastatin (LIPITOR) 40 MG tablet Take 1  tablet (40 mg total) by mouth daily. 08/29/22   Fenton Foy, NP  ferrous sulfate 325 (65 FE) MG tablet Take 1 tablet (325 mg total) by mouth daily after breakfast. 08/01/22   Fenton Foy, NP  tamsulosin (FLOMAX) 0.4 MG CAPS capsule Take 1 capsule (0.4 mg total) by mouth daily. 06/01/22   Fenton Foy, NP    Family History History reviewed. No pertinent family history.  Social History Social History   Tobacco Use   Smoking status: Former    Packs/day: 0.25    Years: 10.00    Total pack years: 2.50    Types: Cigarettes    Quit date: 10/22/2008    Years since quitting: 14.1   Smokeless tobacco: Never  Vaping Use   Vaping Use: Never used  Substance Use Topics   Alcohol use: Yes    Comment: Social   Drug use: Never     Allergies   Patient has no known allergies.   Review of Systems Review of Systems Per HPI  Physical Exam Triage Vital Signs ED Triage Vitals  Enc Vitals Group     BP 12/01/22 0942 93/62     Pulse Rate 12/01/22 0942 89     Resp 12/01/22 0942 15     Temp 12/01/22 0942 98.8 F (37.1 C)     Temp Source 12/01/22 0942 Oral     SpO2 12/01/22 0942 97 %     Weight --  Height --      Head Circumference --      Peak Flow --      Pain Score 12/01/22 0941 0     Pain Loc --      Pain Edu? --      Excl. in [place]? --    No data found.  Updated Vital Signs BP 118/85 (BP Location: Left Arm)   Pulse 89   Temp 98.8 F (37.1 C) (Oral)   Resp 15   SpO2 97%   Visual Acuity Right Eye Distance:   Left Eye Distance:   Bilateral Distance:    Right Eye Near:   Left Eye Near:    Bilateral Near:     Physical Exam Constitutional:      General: He is not in acute distress.    Appearance: Normal appearance. He is not toxic-appearing or diaphoretic.  HENT:     Head: Normocephalic and atraumatic.     Mouth/Throat:     Mouth: Mucous membranes are moist.     Pharynx: No posterior oropharyngeal erythema.  Eyes:     Extraocular Movements:  Extraocular movements intact.     Conjunctiva/sclera: Conjunctivae normal.  Cardiovascular:     Rate and Rhythm: Normal rate and regular rhythm.     Pulses: Normal pulses.     Heart sounds: Normal heart sounds.  Pulmonary:     Effort: Pulmonary effort is normal. No respiratory distress.     Breath sounds: Normal breath sounds.  Abdominal:     General: Bowel sounds are normal. There is no distension.     Palpations: Abdomen is soft.     Tenderness: There is no abdominal tenderness.  Neurological:     General: No focal deficit present.     Mental Status: He is alert and oriented to person, place, and time. Mental status is at baseline.  Psychiatric:        Mood and Affect: Mood normal.        Behavior: Behavior normal.        Thought Content: Thought content normal.        Judgment: Judgment normal.      UC Treatments / Results  Labs (all labs ordered are listed, but only abnormal results are displayed) Labs Reviewed - No data to display  EKG   Radiology No results found.  Procedures Procedures (including critical care time)  Medications Ordered in UC Medications - No data to display  Initial Impression / Assessment and Plan / UC Course  I have reviewed the triage vital signs and the nursing notes.  Pertinent labs & imaging results that were available during my care of the patient were reviewed by me and considered in my medical decision making (see chart for details).     Symptoms appear consistent with viral illness.  There are no signs of acute abdomen or dehydration on exam so do not think that emergent evaluation or imaging is necessary.  Will prescribe ondansetron to take as needed for nausea.  Advised adequate fluid hydration and bland diet.  Advised strict return and ER precautions.  Patient verbalized understanding and was agreeable with plan. Final Clinical Impressions(s) / UC Diagnoses   Final diagnoses:  Viral gastroenteritis  Nausea without vomiting   Diarrhea, unspecified type     Discharge Instructions      It appears that you have a viral stomach virus.  I have prescribed you a medication to take if you feel like you need to throw  up.  Advise adequate fluid hydration and bland diet.  See attached instructions.  Follow-up with the ER symptoms persist or worsen.    ED Prescriptions     Medication Sig Dispense Auth. Provider   ondansetron (ZOFRAN-ODT) 4 MG disintegrating tablet Take 1 tablet (4 mg total) by mouth every 8 (eight) hours as needed for nausea or vomiting. 20 tablet [place], Michele Rockers, [place]      PDMP not reviewed this encounter.   Teodora Medici, [place] 12/01/22 1113

## 2022-12-01 NOTE — Discharge Instructions (Signed)
It appears that you have a viral stomach virus.  I have prescribed you a medication to take if you feel like you need to throw up.  Advise adequate fluid hydration and bland diet.  See attached instructions.  Follow-up with the ER symptoms persist or worsen.

## 2022-12-01 NOTE — ED Triage Notes (Signed)
Pt presents to [facility] with co of fatigue and loose stool since Tuesday. Pt reports he has been taking thera flu and antidiarrhea for symtpoms

## 2022-12-03 ENCOUNTER — Ambulatory Visit: Payer: PRIVATE HEALTH INSURANCE | Admitting: Nurse Practitioner

## 2022-12-28 ENCOUNTER — Other Ambulatory Visit (HOSPITAL_COMMUNITY): Payer: Self-pay

## 2022-12-28 ENCOUNTER — Other Ambulatory Visit: Payer: Self-pay

## 2022-12-28 ENCOUNTER — Other Ambulatory Visit: Payer: Self-pay | Admitting: Nurse Practitioner

## 2022-12-28 DIAGNOSIS — R339 Retention of urine, unspecified: Secondary | ICD-10-CM

## 2022-12-28 MED ORDER — TAMSULOSIN HCL 0.4 MG PO CAPS
0.4000 mg | ORAL_CAPSULE | Freq: Every day | ORAL | 0 refills | Status: DC
  Filled 2022-12-28 – 2022-12-31 (×2): qty 90, 90d supply, fill #0

## 2022-12-31 ENCOUNTER — Other Ambulatory Visit: Payer: Self-pay

## 2022-12-31 ENCOUNTER — Other Ambulatory Visit (HOSPITAL_COMMUNITY): Payer: Self-pay

## 2023-01-09 ENCOUNTER — Ambulatory Visit: Payer: Self-pay | Admitting: Nurse Practitioner

## 2023-01-11 ENCOUNTER — Ambulatory Visit: Payer: PRIVATE HEALTH INSURANCE | Admitting: Nurse Practitioner

## 2023-04-11 ENCOUNTER — Other Ambulatory Visit: Payer: Self-pay

## 2023-04-11 DIAGNOSIS — E7849 Other hyperlipidemia: Secondary | ICD-10-CM

## 2023-04-11 MED ORDER — ATORVASTATIN CALCIUM 40 MG PO TABS
40.0000 mg | ORAL_TABLET | Freq: Every day | ORAL | 1 refills | Status: AC
Start: 2023-04-11 — End: ?

## 2023-06-04 ENCOUNTER — Other Ambulatory Visit: Payer: Self-pay | Admitting: Nurse Practitioner

## 2023-06-04 DIAGNOSIS — R339 Retention of urine, unspecified: Secondary | ICD-10-CM

## 2023-06-05 ENCOUNTER — Other Ambulatory Visit (HOSPITAL_COMMUNITY): Payer: Self-pay

## 2023-06-05 MED ORDER — TAMSULOSIN HCL 0.4 MG PO CAPS
0.4000 mg | ORAL_CAPSULE | Freq: Every day | ORAL | 0 refills | Status: AC
Start: 2023-06-05 — End: ?
  Filled 2023-06-05: qty 90, 90d supply, fill #0

## 2023-06-14 ENCOUNTER — Ambulatory Visit: Payer: Self-pay | Admitting: Nurse Practitioner
